# Patient Record
Sex: Female | Born: 1970
Health system: Southern US, Community
[De-identification: ages and names within clinical notes are randomized; demographics above are authoritative.]

## PROBLEM LIST (undated history)

## (undated) DIAGNOSIS — R3129 Other microscopic hematuria: Secondary | ICD-10-CM

## (undated) DIAGNOSIS — M545 Low back pain: Secondary | ICD-10-CM

## (undated) DIAGNOSIS — K589 Irritable bowel syndrome without diarrhea: Secondary | ICD-10-CM

## (undated) DIAGNOSIS — J302 Other seasonal allergic rhinitis: Secondary | ICD-10-CM

## (undated) DIAGNOSIS — G43909 Migraine, unspecified, not intractable, without status migrainosus: Secondary | ICD-10-CM

## (undated) DIAGNOSIS — J45909 Unspecified asthma, uncomplicated: Secondary | ICD-10-CM

## (undated) DIAGNOSIS — R32 Unspecified urinary incontinence: Secondary | ICD-10-CM

## (undated) DIAGNOSIS — D649 Anemia, unspecified: Secondary | ICD-10-CM

## (undated) DIAGNOSIS — Z87448 Personal history of other diseases of urinary system: Secondary | ICD-10-CM

## (undated) DIAGNOSIS — K5730 Diverticulosis of large intestine without perforation or abscess without bleeding: Secondary | ICD-10-CM

## (undated) DIAGNOSIS — N200 Calculus of kidney: Secondary | ICD-10-CM

## (undated) DIAGNOSIS — Z8719 Personal history of other diseases of the digestive system: Secondary | ICD-10-CM

## (undated) HISTORY — DX: Diverticulosis of large intestine without perforation or abscess without bleeding: K57.30

## (undated) HISTORY — DX: Other seasonal allergic rhinitis: J30.2

## (undated) HISTORY — DX: Migraine, unspecified, not intractable, without status migrainosus: G43.909

## (undated) HISTORY — DX: Anemia, unspecified: D64.9

## (undated) HISTORY — PX: OTHER SURGICAL HISTORY: SHX169

## (undated) HISTORY — DX: Low back pain: M54.5

## (undated) HISTORY — DX: Irritable bowel syndrome without diarrhea: K58.9

## (undated) HISTORY — DX: Personal history of other diseases of urinary system: Z87.448

## (undated) HISTORY — DX: Unspecified asthma, uncomplicated: J45.909

## (undated) HISTORY — DX: Calculus of kidney: N20.0

## (undated) HISTORY — DX: Personal history of other diseases of the digestive system: Z87.19

## (undated) HISTORY — DX: Unspecified urinary incontinence: R32

---
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
## 1996-11-27 HISTORY — PX: OTHER SURGICAL HISTORY: SHX169

## 1999-08-08 ENCOUNTER — Other Ambulatory Visit: Admission: RE | Admit: 1999-08-08 | Discharge: 1999-08-08 | Payer: Self-pay | Admitting: Gynecology

## 1999-09-22 ENCOUNTER — Encounter (INDEPENDENT_AMBULATORY_CARE_PROVIDER_SITE_OTHER): Payer: Self-pay | Admitting: Specialist

## 1999-09-22 ENCOUNTER — Other Ambulatory Visit: Admission: RE | Admit: 1999-09-22 | Discharge: 1999-09-22 | Payer: Self-pay | Admitting: Gynecology

## 1999-11-01 ENCOUNTER — Encounter: Payer: Self-pay | Admitting: Gastroenterology

## 1999-12-02 ENCOUNTER — Other Ambulatory Visit: Admission: RE | Admit: 1999-12-02 | Discharge: 1999-12-02 | Payer: Self-pay | Admitting: Internal Medicine

## 1999-12-31 ENCOUNTER — Encounter: Payer: Self-pay | Admitting: Family Medicine

## 1999-12-31 ENCOUNTER — Ambulatory Visit (HOSPITAL_COMMUNITY): Admission: RE | Admit: 1999-12-31 | Discharge: 1999-12-31 | Payer: Self-pay | Admitting: Family Medicine

## 2000-02-27 ENCOUNTER — Encounter: Payer: Self-pay | Admitting: Gastroenterology

## 2000-11-07 ENCOUNTER — Other Ambulatory Visit: Admission: RE | Admit: 2000-11-07 | Discharge: 2000-11-07 | Payer: Self-pay | Admitting: Gynecology

## 2001-02-13 ENCOUNTER — Other Ambulatory Visit: Admission: RE | Admit: 2001-02-13 | Discharge: 2001-02-13 | Payer: Self-pay | Admitting: Gynecology

## 2001-11-12 ENCOUNTER — Other Ambulatory Visit: Admission: RE | Admit: 2001-11-12 | Discharge: 2001-11-12 | Payer: Self-pay | Admitting: Gynecology

## 2002-01-27 ENCOUNTER — Other Ambulatory Visit: Admission: RE | Admit: 2002-01-27 | Discharge: 2002-01-27 | Payer: Self-pay | Admitting: Gynecology

## 2002-04-07 ENCOUNTER — Ambulatory Visit (HOSPITAL_COMMUNITY): Admission: RE | Admit: 2002-04-07 | Discharge: 2002-04-07 | Payer: Self-pay | Admitting: Internal Medicine

## 2002-04-07 ENCOUNTER — Encounter: Payer: Self-pay | Admitting: Gastroenterology

## 2002-04-07 ENCOUNTER — Encounter: Payer: Self-pay | Admitting: Internal Medicine

## 2002-05-27 ENCOUNTER — Other Ambulatory Visit: Admission: RE | Admit: 2002-05-27 | Discharge: 2002-05-27 | Payer: Self-pay | Admitting: Gynecology

## 2002-10-08 ENCOUNTER — Other Ambulatory Visit: Admission: RE | Admit: 2002-10-08 | Discharge: 2002-10-08 | Payer: Self-pay | Admitting: Gynecology

## 2003-01-07 ENCOUNTER — Other Ambulatory Visit: Admission: RE | Admit: 2003-01-07 | Discharge: 2003-01-07 | Payer: Self-pay | Admitting: Gynecology

## 2004-01-11 ENCOUNTER — Other Ambulatory Visit: Admission: RE | Admit: 2004-01-11 | Discharge: 2004-01-11 | Payer: Self-pay | Admitting: Gynecology

## 2005-01-02 ENCOUNTER — Other Ambulatory Visit: Admission: RE | Admit: 2005-01-02 | Discharge: 2005-01-02 | Payer: Self-pay | Admitting: Gynecology

## 2005-07-28 ENCOUNTER — Inpatient Hospital Stay (HOSPITAL_COMMUNITY): Admission: AD | Admit: 2005-07-28 | Discharge: 2005-07-31 | Payer: Self-pay | Admitting: Gynecology

## 2005-09-11 ENCOUNTER — Other Ambulatory Visit: Admission: RE | Admit: 2005-09-11 | Discharge: 2005-09-11 | Payer: Self-pay | Admitting: Gynecology

## 2005-11-14 ENCOUNTER — Ambulatory Visit: Payer: Self-pay | Admitting: Pulmonary Disease

## 2005-11-20 ENCOUNTER — Emergency Department (HOSPITAL_COMMUNITY): Admission: EM | Admit: 2005-11-20 | Discharge: 2005-11-20 | Payer: Self-pay | Admitting: Emergency Medicine

## 2005-11-21 ENCOUNTER — Ambulatory Visit: Payer: Self-pay | Admitting: Internal Medicine

## 2005-11-22 ENCOUNTER — Encounter: Payer: Self-pay | Admitting: Gastroenterology

## 2005-11-22 ENCOUNTER — Ambulatory Visit: Payer: Self-pay | Admitting: *Deleted

## 2006-02-22 ENCOUNTER — Ambulatory Visit: Payer: Self-pay | Admitting: Internal Medicine

## 2006-04-26 ENCOUNTER — Ambulatory Visit: Payer: Self-pay | Admitting: Internal Medicine

## 2006-08-20 ENCOUNTER — Ambulatory Visit: Payer: Self-pay | Admitting: Internal Medicine

## 2006-09-14 ENCOUNTER — Other Ambulatory Visit: Admission: RE | Admit: 2006-09-14 | Discharge: 2006-09-14 | Payer: Self-pay | Admitting: Gynecology

## 2006-09-17 ENCOUNTER — Ambulatory Visit (HOSPITAL_COMMUNITY): Admission: RE | Admit: 2006-09-17 | Discharge: 2006-09-17 | Payer: Self-pay | Admitting: Gynecology

## 2006-12-20 ENCOUNTER — Ambulatory Visit: Payer: Self-pay | Admitting: Internal Medicine

## 2007-05-29 ENCOUNTER — Ambulatory Visit (HOSPITAL_COMMUNITY): Admission: RE | Admit: 2007-05-29 | Discharge: 2007-05-29 | Payer: Self-pay | Admitting: Obstetrics and Gynecology

## 2007-06-19 ENCOUNTER — Ambulatory Visit (HOSPITAL_COMMUNITY): Admission: RE | Admit: 2007-06-19 | Discharge: 2007-06-19 | Payer: Self-pay | Admitting: Obstetrics and Gynecology

## 2007-08-30 ENCOUNTER — Inpatient Hospital Stay (HOSPITAL_COMMUNITY): Admission: AD | Admit: 2007-08-30 | Discharge: 2007-08-30 | Payer: Self-pay | Admitting: Obstetrics and Gynecology

## 2007-09-04 ENCOUNTER — Inpatient Hospital Stay (HOSPITAL_COMMUNITY): Admission: AD | Admit: 2007-09-04 | Discharge: 2007-09-07 | Payer: Self-pay | Admitting: Obstetrics and Gynecology

## 2007-12-24 ENCOUNTER — Encounter: Payer: Self-pay | Admitting: Internal Medicine

## 2007-12-24 DIAGNOSIS — Z87448 Personal history of other diseases of urinary system: Secondary | ICD-10-CM

## 2007-12-24 DIAGNOSIS — Z8719 Personal history of other diseases of the digestive system: Secondary | ICD-10-CM

## 2007-12-24 DIAGNOSIS — M545 Low back pain: Secondary | ICD-10-CM | POA: Insufficient documentation

## 2007-12-24 HISTORY — DX: Personal history of other diseases of the digestive system: Z87.19

## 2008-04-30 ENCOUNTER — Encounter: Admission: RE | Admit: 2008-04-30 | Discharge: 2008-07-29 | Payer: Self-pay | Admitting: Obstetrics and Gynecology

## 2008-06-10 ENCOUNTER — Ambulatory Visit: Payer: Self-pay | Admitting: Internal Medicine

## 2008-06-10 LAB — CONVERTED CEMR LAB
ALT: 25 units/L (ref 0–35)
Basophils Absolute: 0.1 10*3/uL (ref 0.0–0.1)
Bilirubin Urine: NEGATIVE
Bilirubin, Direct: 0.1 mg/dL (ref 0.0–0.3)
CO2: 30 meq/L (ref 19–32)
Calcium: 10 mg/dL (ref 8.4–10.5)
Cholesterol: 202 mg/dL (ref 0–200)
Creatinine, Ser: 0.7 mg/dL (ref 0.4–1.2)
Crystals: NEGATIVE
Direct LDL: 134.5 mg/dL
Eosinophils Absolute: 0.3 10*3/uL (ref 0.0–0.7)
GFR calc Af Amer: 121 mL/min
HCT: 35.4 % — ABNORMAL LOW (ref 36.0–46.0)
Hemoglobin: 12.5 g/dL (ref 12.0–15.0)
Leukocytes, UA: NEGATIVE
MCHC: 35.4 g/dL (ref 30.0–36.0)
MCV: 83.4 fL (ref 78.0–100.0)
Monocytes Absolute: 0.6 10*3/uL (ref 0.1–1.0)
Monocytes Relative: 6.8 % (ref 3.0–12.0)
Neutro Abs: 4.1 10*3/uL (ref 1.4–7.7)
Nitrite: NEGATIVE
Platelets: 306 10*3/uL (ref 150–400)
RDW: 11.7 % (ref 11.5–14.6)
Sodium: 141 meq/L (ref 135–145)
TSH: 0.87 microintl units/mL (ref 0.35–5.50)
Total Bilirubin: 0.8 mg/dL (ref 0.3–1.2)
Total CHOL/HDL Ratio: 4.6
Total Protein: 7.9 g/dL (ref 6.0–8.3)
Triglycerides: 161 mg/dL — ABNORMAL HIGH (ref 0–149)
Urobilinogen, UA: 0.2 (ref 0.0–1.0)
WBC, UA: NONE SEEN cells/hpf

## 2008-06-15 ENCOUNTER — Ambulatory Visit: Payer: Self-pay | Admitting: Internal Medicine

## 2008-06-15 DIAGNOSIS — R32 Unspecified urinary incontinence: Secondary | ICD-10-CM

## 2008-06-15 HISTORY — DX: Unspecified urinary incontinence: R32

## 2008-06-25 ENCOUNTER — Ambulatory Visit: Payer: Self-pay | Admitting: Internal Medicine

## 2008-06-25 ENCOUNTER — Telehealth: Payer: Self-pay | Admitting: Internal Medicine

## 2008-06-25 LAB — CONVERTED CEMR LAB
Bacteria, UA: NEGATIVE
Bilirubin Urine: NEGATIVE
Crystals: NEGATIVE
Ketones, ur: NEGATIVE mg/dL
Leukocytes, UA: NEGATIVE
Nitrite: NEGATIVE
Specific Gravity, Urine: 1.01 (ref 1.000–1.03)
Urobilinogen, UA: 0.2 (ref 0.0–1.0)
pH: 7 (ref 5.0–8.0)

## 2008-06-26 ENCOUNTER — Telehealth: Payer: Self-pay | Admitting: Internal Medicine

## 2008-11-05 ENCOUNTER — Ambulatory Visit: Payer: Self-pay | Admitting: Internal Medicine

## 2008-11-07 LAB — CONVERTED CEMR LAB
Basophils Absolute: 0.1 10*3/uL (ref 0.0–0.1)
Bilirubin Urine: NEGATIVE
Leukocytes, UA: NEGATIVE
Lymphocytes Relative: 28.8 % (ref 12.0–46.0)
MCHC: 35 g/dL (ref 30.0–36.0)
Monocytes Relative: 5.2 % (ref 3.0–12.0)
Neutro Abs: 7.2 10*3/uL (ref 1.4–7.7)
Neutrophils Relative %: 61.7 % (ref 43.0–77.0)
Nitrite: NEGATIVE
Platelets: 314 10*3/uL (ref 150–400)
RDW: 11.7 % (ref 11.5–14.6)
Specific Gravity, Urine: 1.025 (ref 1.000–1.03)
pH: 6 (ref 5.0–8.0)

## 2009-04-14 ENCOUNTER — Ambulatory Visit: Payer: Self-pay | Admitting: Gastroenterology

## 2009-04-14 DIAGNOSIS — R109 Unspecified abdominal pain: Secondary | ICD-10-CM

## 2009-04-14 DIAGNOSIS — R197 Diarrhea, unspecified: Secondary | ICD-10-CM | POA: Insufficient documentation

## 2009-04-14 DIAGNOSIS — R1032 Left lower quadrant pain: Secondary | ICD-10-CM

## 2009-04-14 LAB — CONVERTED CEMR LAB
ALT: 20 units/L (ref 0–35)
AST: 21 units/L (ref 0–37)
Basophils Absolute: 0.1 10*3/uL (ref 0.0–0.1)
Calcium: 9.7 mg/dL (ref 8.4–10.5)
Chloride: 107 meq/L (ref 96–112)
Creatinine, Ser: 0.8 mg/dL (ref 0.4–1.2)
Eosinophils Absolute: 0.5 10*3/uL (ref 0.0–0.7)
HCT: 38 % (ref 36.0–46.0)
Hemoglobin: 13.1 g/dL (ref 12.0–15.0)
Lymphs Abs: 2.8 10*3/uL (ref 0.7–4.0)
MCHC: 34.5 g/dL (ref 30.0–36.0)
Neutro Abs: 4.1 10*3/uL (ref 1.4–7.7)
RDW: 12.5 % (ref 11.5–14.6)
Sodium: 141 meq/L (ref 135–145)
Total Bilirubin: 0.5 mg/dL (ref 0.3–1.2)

## 2009-04-19 ENCOUNTER — Ambulatory Visit: Payer: Self-pay | Admitting: Gastroenterology

## 2009-04-19 HISTORY — PX: COLONOSCOPY: SHX174

## 2009-04-20 ENCOUNTER — Encounter (INDEPENDENT_AMBULATORY_CARE_PROVIDER_SITE_OTHER): Payer: Self-pay

## 2009-06-24 ENCOUNTER — Ambulatory Visit: Payer: Self-pay | Admitting: Internal Medicine

## 2009-06-27 HISTORY — PX: OTHER SURGICAL HISTORY: SHX169

## 2009-06-27 HISTORY — PX: TOTAL VAGINAL HYSTERECTOMY: SHX2548

## 2009-06-27 HISTORY — PX: ABDOMINAL HYSTERECTOMY: SHX81

## 2009-07-21 ENCOUNTER — Ambulatory Visit: Payer: Self-pay | Admitting: Gastroenterology

## 2009-07-21 DIAGNOSIS — K5730 Diverticulosis of large intestine without perforation or abscess without bleeding: Secondary | ICD-10-CM | POA: Insufficient documentation

## 2009-07-21 DIAGNOSIS — K589 Irritable bowel syndrome without diarrhea: Secondary | ICD-10-CM

## 2009-07-21 HISTORY — DX: Diverticulosis of large intestine without perforation or abscess without bleeding: K57.30

## 2009-07-21 HISTORY — DX: Irritable bowel syndrome, unspecified: K58.9

## 2009-12-22 ENCOUNTER — Telehealth: Payer: Self-pay | Admitting: Internal Medicine

## 2010-07-23 ENCOUNTER — Ambulatory Visit: Payer: Self-pay | Admitting: Diagnostic Radiology

## 2010-07-23 ENCOUNTER — Emergency Department (HOSPITAL_BASED_OUTPATIENT_CLINIC_OR_DEPARTMENT_OTHER): Admission: EM | Admit: 2010-07-23 | Discharge: 2010-07-23 | Payer: Self-pay | Admitting: Emergency Medicine

## 2010-12-19 ENCOUNTER — Encounter: Payer: Self-pay | Admitting: Obstetrics and Gynecology

## 2010-12-27 NOTE — Progress Notes (Signed)
Summary: head injury?  Phone Note Call from Patient Call back at Home Phone [phone]   Summary of Call: Patient left message on triage that she hit her head this AM. Patient states that it is a tingle/stinging sensation abover her left ear and she has a headache. Per the patient it is not severe, but she would like to know what MD recommends. Initial call taken by: Lucious Groves,  December 22, 2009 12:04 PM  Follow-up for Phone Call        Per MD he will be more than happy to see her, but if it is not severe and she does not wish to come to the office, take OTC pain relief like Aleve and call if symptoms worsen.  Patient notified, and will take otc pain reliever and call if symptoms worsen. Per patient she was not bleeding, and did not have a knot/bump from the injury, she just didn't feel well when she went to [place] this AM. Patient will call as needed. Follow-up by: Lucious Groves,  December 22, 2009 12:06 PM

## 2011-04-11 NOTE — H&P (Signed)
[name], [name]              ACCOUNT NO.:  [id number]   MEDICAL RECORD NO.:  [id number]          PATIENT TYPE:  INP   LOCATION:  9113                          [facility]ACILITY:  [facility]   PHYSICIAN:  Naima A. Dillard, M.D. DATE OF BIRTH:  Nov 04, 1971   DATE OF ADMISSION:  09/04/2007  DATE OF DISCHARGE:                              HISTORY & PHYSICAL   HISTORY OF PRESENT ILLNESS:  [name] is a 40 year old, G2, P1-0-0-1  at 38-2/7 weeks' who presented complaining of spontaneous rupture of  membranes approximately at 8:30 p.m. with clear fluid noted.  Uterine  contractions every 10 minutes per patient report.  Group B Streptococcus  culture is negative.  Pregnancy has been remarkable for previous  cesarean section with patient desiring VBAC.  She progressed to  completely dilated and pushed for 2 hours with her last baby.  History  of LEEP procedure and laser surgery on cervix.  She has had migraines  and has advanced maternal age with a normal quadruple screen, but  amniocentesis declined.   PRENATAL LABORATORY DATA:  Blood type is O positive, Rh antibody  negative.  VDRL nonreactive.  Rubella titer positive.  Hepatitis B  surface antigen negative.  HIV nonreactive.  GC and chlamydia cultures  were negative.  Pap was normal on October 2007.  Hemoglobin upon  entering the practice was 11.8.  It was within normal limits at 28  weeks.  The patient declined first-trimester screening.  Quadruple  screen was normal.  She had a Glucola that was 136.  She had a 3-hour  GTT that was normal.  Group B Streptococcus culture was negative at 36  weeks.  HIV was nonreactive.   PRENATAL COURSE:  The patient entered care at approximately 10 weeks.  She initially planned nurse midwife care, but then changed to physician  care with Dr. Stefano Gaul.  She did want VBAC.  VBAC consent form was  signed at 18 weeks.  She had an ultrasound at 14 weeks secondary to  history of LEEP procedure with cervical length  adequate.  She had  another ultrasound at 18 weeks showed normal growth with cervical length  of 3.02.  Quadruple screen was done at that time.  At 22 weeks, she had  another ultrasound at 23 weeks secondary to cervical length.  Cervix was  within normal limits, but there was evidence of lateral ventricle  dilated.  Maternal fetal medicine consult was anticipated.  Dr. Estanislado Pandy  was consulted as well.  A followup ultrasound was done with maternal  fetal medicine on July 2, showing ventricles normal and otherwise normal  anatomy.  At 23 weeks, these were normal.  Normal quadruple screen.  The  patient declined the followup ultrasound.  She had an elevated 1-hour  Glucola at 136.  She had a normal 3-hour GTT.  The rest of her pregnancy  was essentially uncomplicated.  Group B Streptococcus culture was  negative at 36 weeks.   PAST OBSTETRICAL HISTORY:  In 2006, she had a primary low transverse  cesarean section for a female infant with weight 7 pounds at 40 weeks.  She was in labor 8-9 hours.  She had epidural anesthesia.  She had a C-  section for failure to progress and pushed x2 hours.  She did take iron  supplementation during that.   PAST MEDICAL HISTORY:  1. In 1995 and 1996, she had a LEEP procedure and laser surgery of the      cervix and cryosurgery.  2. Usual childhood illnesses.  3. Questionable heart murmur in the past, but has not required any      medication.  This was only present with her last pregnancy.  4. Anemia in 1995.  5. Exercised-induced asthma, but that has not been a problem in      several years.  6. She has occasional migraines.  7. She reports occasional UTIs.  8. At age 12, she had an accident that involved her arm.   PAST SURGICAL HISTORY:  1. LEEP in 1995.  2. C-section in 2006.   ALLERGIES:  No known medication allergies.   FAMILY HISTORY:  Her mother is hypertensive on medication.  Maternal  grandmother had diabetes.  Maternal grandfather had some  type of eye  cancer.   GENETIC HISTORY:  The patient's age is 89 with amniocentesis declined,  but normal quadruple screen.   SOCIAL HISTORY:  The patient is married to the father of the baby.  He  is involved and supportive.  His name is Statistician.  The patient is  Hispanic in ethnicity.  She is of the [employer].  She is college  educated.  She is in real estate.  Her husband is also college educated  and is a Conservation officer, nature.  She has been followed initially by the midwife  service, but then switched to physician care.  She denied any alcohol,  drug or tobacco use during this pregnancy.   PHYSICAL EXAMINATION:  VITAL SIGNS:  Initial blood pressure is 143/92.  Followup blood pressures were normal.  Other vital signs were stable.  HEENT:  Within normal limits.  LUNGS:  Breath sounds are clear.  HEART:  Regular rate and rhythm without murmurs.  BREASTS:  Soft and nontender.  ABDOMEN:  Fundal height is approximately 38 cm.  Estimated fetal weight  7 to 7-1/2 pounds.  Uterine contractions are every 5 minutes with some  small uterine contractions between.  Abdomen is soft and nontender  between contractions.  Fetal heart was reactive with no decelerations.  PELVIC:  The patient is noted to be leaking clear fluid.  Cervix is 5  cm, 100%, vertex at -2 station with forewaters noted.  EXTREMITIES:  Deep tendon reflexes are 2+ without clonus.  There is a  trace edema noted.   IMPRESSION:  1. Intrauterine pregnancy at 38-2/7 weeks.  2. Active labor.  3. Previous cesarean section with plan for vaginal birth after      cesarean.  4. Negative Group B Streptococcus.   PLAN:  1. Admit to birthing suite for consult with Dr. Normand Sloop, attending      physician.  2. Routine physician orders.  3. Epidural p.r.n.      Renaldo Reel Emilee Hero, C.N.M.      Naima A. Normand Sloop, M.D.  Electronically Signed    VLL/MEDQ  D:  09/04/2007  T:  09/05/2007  Job:  [id number]

## 2011-04-14 NOTE — Op Note (Signed)
[name], [name]              ACCOUNT NO.:  [id number]   MEDICAL RECORD NO.:  [id number]          PATIENT TYPE:  INP   LOCATION:  9316                          FACILITY:  [facility]   PHYSICIAN:  Ivor Costa. Farrel Gobble, M.D. DATE OF BIRTH:  09-Sep-1971   DATE OF PROCEDURE:  07/29/2005  DATE OF DISCHARGE:                                 OPERATIVE REPORT   PREOPERATIVE DIAGNOSIS:  Deep OT arrest.   POSTOPERATIVE DIAGNOSIS:  Deep OT arrest.   PROCEDURE:  Primary cesarean section, low flap transverse.   SURGEON:  Ivor Costa. Farrel Gobble, M.D.   ANESTHESIA:  Epidural.   FLUIDS REPLACED:  2800 mL of Ringer's lactate.   ESTIMATED BLOOD LOSS:  400 mL.   URINE OUTPUT:  400 mL of clear urine.   FINDINGS:  A viable female in the ROT presentation, thin meconium, Apgars 9  and 9, birth weight 7 pounds.  Edematous changes to the bladder.  Normal  uterus, tubes, and ovaries.   COMPLICATIONS:  None.   PATHOLOGY:  None.   DESCRIPTION OF PROCEDURE:  The patient was taken to the operating room and  placed in the supine position with left lateral displacement.  Prepped and  draped in the usual sterile fashion.  After adequate anesthesia was ensured,  a Pfannenstiel skin incision was made with the scalpel and carried through  the underlying layer of fascia with electrocautery.  The fascia was scored  in the midline and the incision was extended laterally with the  electrocautery.  The inferior aspect of the fascial incision was grasped  with Kochers.  Underlying rectus muscles were dissected off by blunt and  sharp dissection.  In a similar fashion the superior aspect of the incision  was grasped with Kochers and the underlying rectus muscle were dissected  off.  The rectus muscles were naturally separated in the midline.  The  peritoneum was identified and entered bluntly.  The peritoneal incision was  then extended superiorly and inferiorly with good visualization of  underlying bowel and bladder.  The  bladder blade was inserted.  The bladder  was noted to be markedly edematous.  The vesicouterine peritoneum was  identified, tented up, and entered sharply with the Metzenbaum scissors.  The incision was extended laterally.  Because of the marked edema of the  bladder as well as the varicosity at the bladder edge, we did not drop the  bladder.  Instead we lifted the peritoneum superiorly.  The transverse  incision was then made with the scalpel.  At the upper edge of the cleared  area, thin meconium was noted upon entering the cavity.  The incision was  then extended bluntly.  The infant was then delivered from the vertex  presentation.  The cord was cut and clamped and handed off to the awaiting  pediatricians.  Cord blood was obtained.  The uterus was massaged.  The  placenta was allowed to separate naturally.  The uterus was then cleared of  all clots and debris.  The uterine incision was repaired with a running  locked layer of 0 chromic and a second suture  was used for imbrication.  The  pelvis was then irrigated with copious amount of warm saline.  The adnexa  were inspected and noted to be unremarkable.  The gutters were cleared of  all clots and debris.  The incision was inspected and noted to be  hemostatic.  The peritoneum was inspected and treated where appropriate.  The fascia was closed with 0 Vicryl in a running fashion.  The subcu was  irrigated and reapproximated with 3-0 plain.  The skin was closed with 4-0  Vicryl on a [name].  Steri-Strips were placed.  The patient tolerated the  procedure well.  Needle, sponge, and instrument counts correct x2.  She  received Ancef intraoperatively and transferred to the PACU in stable  condition.      Ivor Costa. Farrel Gobble, M.D.  Electronically Signed     THL/MEDQ  D:  07/29/2005  T:  07/29/2005  Job:  [id number]

## 2011-04-14 NOTE — Discharge Summary (Signed)
[name], [name]              ACCOUNT NO.:  [id number]   MEDICAL RECORD NO.:  [id number]          PATIENT TYPE:  INP   LOCATION:  9316                          [facility]ILITY:  [facility]   PHYSICIAN:  Ivor Costa. Farrel Gobble, M.D. DATE OF BIRTH:  Jan 19, 1971   DATE OF ADMISSION:  07/28/2005  DATE OF DISCHARGE:  07/30/2005                                 DISCHARGE SUMMARY   PRINCIPAL DIAGNOSIS:  Term pregnancy.   PRINCIPAL PROCEDURE:  Primary cesarean section.   HOSPITAL COURSE:  The patient presented on the evening of July 28, 2005,  in spontaneous labor with gross rupture of membranes at 6 cm.  She  progressed to fully dilated without anesthesia but began having terrible  pain and therefore received an epidural secondary to discomfort.  She  required Pitocin augmentation after the epidural was placed and after  approximately an hour plus of pushing, the patient failed to descend.  The  vaginal exam at that point showed the patient to be, the deep ROT with  marked asynclitism of the vertex with the fetal ear below the pubic  symphysis.  Based on that, the patient was transferred to the OR and  underwent a primary cesarean section for delivery of a viable female with  Apgars of 9 and 9, birth weight of 7 pounds 0 ounces, with normal uterus,  tubes, and ovaries.  Estimated blood loss intraoperatively was approximately  400 mL.  Her postoperative course was unremarkable.   On postop day #2, the patient was breast-feeding without difficulty,  ambulating without difficulty and tolerating pain medicine and was ready for  discharge.  She remained afebrile throughout.  Her abdomen was soft,  nontender, without rebound or guarding.  Her uterus was firm and below the  umbilicus.  The incision was clean, dry, and intact with suture.  The  extremities were negative.   Postop labs:  Her white count was 19.5, her hemoglobin was 9.1, her  hematocrit was 27.2, her platelets were 298.   DISCHARGE STATUS:   Stable.  The patient was discharged home with a  prescription for Percocet 5/325 mg one to two q.4h. p.r.n. #30, instructed  to use over-the-counter Motrin, instructed to follow up in the office in six  weeks.      Ivor Costa. Farrel Gobble, M.D.  Electronically Signed     THL/MEDQ  D:  07/31/2005  T:  07/31/2005  Job:  [id number]

## 2011-06-15 ENCOUNTER — Encounter: Payer: Self-pay | Admitting: Internal Medicine

## 2011-06-15 ENCOUNTER — Ambulatory Visit (INDEPENDENT_AMBULATORY_CARE_PROVIDER_SITE_OTHER): Payer: 59 | Admitting: Internal Medicine

## 2011-06-15 DIAGNOSIS — J019 Acute sinusitis, unspecified: Secondary | ICD-10-CM | POA: Insufficient documentation

## 2011-06-15 DIAGNOSIS — F419 Anxiety disorder, unspecified: Secondary | ICD-10-CM

## 2011-06-15 DIAGNOSIS — J209 Acute bronchitis, unspecified: Secondary | ICD-10-CM

## 2011-06-15 DIAGNOSIS — H6990 Unspecified Eustachian tube disorder, unspecified ear: Secondary | ICD-10-CM

## 2011-06-15 DIAGNOSIS — H6980 Other specified disorders of Eustachian tube, unspecified ear: Secondary | ICD-10-CM

## 2011-06-15 DIAGNOSIS — F411 Generalized anxiety disorder: Secondary | ICD-10-CM

## 2011-06-15 MED ORDER — HYDROCODONE-HOMATROPINE 5-1.5 MG/5ML PO SYRP: 5.0000 mL | ORAL_SOLUTION | Freq: Four times a day (QID) | ORAL | Status: AC | PRN

## 2011-06-15 MED ORDER — LEVOFLOXACIN 500 MG PO TABS: 500.0000 mg | ORAL_TABLET | Freq: Every day | ORAL | Status: AC

## 2011-06-15 NOTE — Assessment & Plan Note (Signed)
Mild to mod, for antibx course,  to f/u any worsening symptoms or concerns 

## 2011-06-15 NOTE — Assessment & Plan Note (Signed)
For mucinex otc prn,  to f/u any worsening symptoms or concerns  

## 2011-06-15 NOTE — Patient Instructions (Signed)
Take all new medications as prescribed Continue all other medications as before You can also take  Mucinex (or it's generic off brand) for congestion (chest, sinus, ears)

## 2011-06-15 NOTE — Assessment & Plan Note (Signed)
Mild situational, ok to follow without meds at this time

## 2011-06-15 NOTE — Progress Notes (Signed)
  Subjective:    Patient ID: [name], female    DOB: [birth year], 40 y.o.   MRN: [id number]  HPI   Here with 3 days acute onset fever, facial pain, pressure, general weakness and malaise, and greenish d/c, with slight ST, but little to no cough and Pt denies chest pain, increased sob or doe, wheezing, orthopnea, PND, increased LE swelling, palpitations, dizziness or syncope.  Also Here with acute onset mild to mod 2-3 days ST, HA, general weakness and malaise, with prod cough greenish sputum.  Denies worsening depressive symptoms, suicidal ideation, or panic, but mild anxiety increased recently with illness.  Pt denies new neurological symptoms such as new headache, or facial or extremity weakness or numbness   Pt denies polydipsia, polyuria.  Does also have some left ear fullness with popping/crackling but no worsening hearing loss, veritgo, n/v Past Medical History  Diagnosis Date  . Abdominal pain, left lower quadrant 04/14/2009  . ABDOMINAL PAIN-MULTIPLE SITES 04/14/2009  . Diarrhea 04/14/2009  . DIVERTICULITIS, HX OF 12/24/2007  . DIVERTICULOSIS-COLON 07/21/2009  . HEMATURIA, HX OF 12/24/2007  . Irritable bowel syndrome 07/21/2009  . LOW BACK PAIN 12/24/2007  . URINARY INCONTINENCE 06/15/2008   Past Surgical History  Procedure Date  . Abdominal hysterectomy 06/2009  . Rectocele/cystocele repair 06/2009  . Leap 98  . Right fibula fx     age 45  . G2 p2     1 c-section, 1 NSVD    reports that she has quit smoking. She does not have any smokeless tobacco history on file. She reports that she does not use illicit drugs. Her alcohol history not on file. family history includes Colon polyps in her mother; Diabetes in her maternal grandmother; Hyperlipidemia in her mother; and Hypertension in her mother. No Known Allergies No current outpatient prescriptions on file prior to visit.   Review of Systems Review of Systems  Constitutional: Negative for diaphoresis and unexpected weight change.    HENT: Negative for drooling and tinnitus.   Eyes: Negative for photophobia and visual disturbance.  Respiratory: Negative for choking and stridor.   Gastrointestinal: Negative for vomiting and blood in stool.  Genitourinary: Negative for hematuria and decreased urine volume.      Objective:   Physical Exam BP 98/72  Pulse 90  Temp(Src) 98.1 F (36.7 C) (Oral)  Ht 5' (1.524 m)  Wt 128 lb (58.06 kg)  BMI 25.00 kg/m2  SpO2 97% Physical Exam  VS noted, mild ill Constitutional: Pt appears well-developed and well-nourished.  HENT: Head: Normocephalic.  Right Ear: External ear normal.  Right TM clear Left Ear: External ear normal.  Left TM mod erythema Bilat tm's mild erythema.  Sinus tender bilat.  Pharynx mild erythema Eyes: Conjunctivae and EOM are normal. Pupils are equal, round, and reactive to light.  Neck: Normal range of motion. Neck supple.  Cardiovascular: Normal rate and regular rhythm.   Pulmonary/Chest: Effort normal and breath sounds normal.  Neurological: Pt is alert. No cranial nerve deficit.  Skin: Skin is warm. No erythema.  Psychiatric: Pt behavior is normal. Thought content normal. 1+ nervous        Assessment & Plan:

## 2011-07-07 ENCOUNTER — Encounter (HOSPITAL_BASED_OUTPATIENT_CLINIC_OR_DEPARTMENT_OTHER): Payer: Self-pay | Admitting: *Deleted

## 2011-07-07 ENCOUNTER — Emergency Department (HOSPITAL_BASED_OUTPATIENT_CLINIC_OR_DEPARTMENT_OTHER)
Admission: EM | Admit: 2011-07-07 | Discharge: 2011-07-07 | Disposition: A | Discharge status: Other Acute Inpatient Hospital | Payer: 59 | Source: Home / Self Care | Attending: Emergency Medicine | Admitting: Emergency Medicine

## 2011-07-07 ENCOUNTER — Emergency Department (INDEPENDENT_AMBULATORY_CARE_PROVIDER_SITE_OTHER): Payer: 59

## 2011-07-07 ENCOUNTER — Inpatient Hospital Stay (HOSPITAL_COMMUNITY)
Admission: AD | Admit: 2011-07-07 | Discharge: 2011-07-09 | DRG: 203 | Disposition: A | Discharge status: Home / Self Care | Payer: 59 | Source: Other Acute Inpatient Hospital | Attending: Internal Medicine | Admitting: Internal Medicine

## 2011-07-07 DIAGNOSIS — J45902 Unspecified asthma with status asthmaticus: Secondary | ICD-10-CM

## 2011-07-07 DIAGNOSIS — D72829 Elevated white blood cell count, unspecified: Secondary | ICD-10-CM | POA: Diagnosis present

## 2011-07-07 DIAGNOSIS — J40 Bronchitis, not specified as acute or chronic: Secondary | ICD-10-CM

## 2011-07-07 DIAGNOSIS — R7309 Other abnormal glucose: Secondary | ICD-10-CM | POA: Diagnosis present

## 2011-07-07 DIAGNOSIS — J45909 Unspecified asthma, uncomplicated: Principal | ICD-10-CM | POA: Diagnosis present

## 2011-07-07 DIAGNOSIS — R0602 Shortness of breath: Secondary | ICD-10-CM

## 2011-07-07 DIAGNOSIS — R05 Cough: Secondary | ICD-10-CM

## 2011-07-07 DIAGNOSIS — K589 Irritable bowel syndrome without diarrhea: Secondary | ICD-10-CM | POA: Insufficient documentation

## 2011-07-07 LAB — DIFFERENTIAL
Basophils Relative: 0 % (ref 0–1)
Basophils Relative: 0 % (ref 0–1)
Eosinophils Absolute: 0.9 10*3/uL — ABNORMAL HIGH (ref 0.0–0.7)
Eosinophils Absolute: 0 10*3/uL (ref 0.0–0.7)
Lymphs Abs: 0.9 10*3/uL (ref 0.7–4.0)
Monocytes Absolute: 0.8 10*3/uL (ref 0.1–1.0)
Monocytes Relative: 7 % (ref 3–12)
Neutro Abs: 8.1 10*3/uL — ABNORMAL HIGH (ref 1.7–7.7)
Neutro Abs: 7.9 10*3/uL — ABNORMAL HIGH (ref 1.7–7.7)
Neutrophils Relative %: 85 % — ABNORMAL HIGH (ref 43–77)

## 2011-07-07 LAB — CBC
HCT: 37.4 % (ref 36.0–46.0)
Hemoglobin: 12.9 g/dL (ref 12.0–15.0)
Hemoglobin: 12.1 g/dL (ref 12.0–15.0)
MCH: 29.8 pg (ref 26.0–34.0)
MCHC: 34.5 g/dL (ref 30.0–36.0)
Platelets: 296 10*3/uL (ref 150–400)
RBC: 4.23 MIL/uL (ref 3.87–5.11)
WBC: 9.3 10*3/uL (ref 4.0–10.5)

## 2011-07-07 LAB — BASIC METABOLIC PANEL
BUN: 10 mg/dL (ref 6–23)
CO2: 23 mEq/L (ref 19–32)
Calcium: 10.3 mg/dL (ref 8.4–10.5)
Chloride: 100 mEq/L (ref 96–112)
Chloride: 103 mEq/L (ref 96–112)
Creatinine, Ser: 0.6 mg/dL (ref 0.50–1.10)
GFR calc non Af Amer: >60 mL/min (ref >60)
Potassium: 4 mEq/L (ref 3.5–5.1)
Sodium: 137 mEq/L (ref 135–145)

## 2011-07-07 LAB — MRSA PCR SCREENING: MRSA by PCR: NEGATIVE

## 2011-07-07 MED ORDER — ALBUTEROL SULFATE (5 MG/ML) 0.5% IN NEBU
INHALATION_SOLUTION | RESPIRATORY_TRACT | Status: AC
  Administered 2011-07-07: 5 mg via RESPIRATORY_TRACT
  Filled 2011-07-07: qty 1

## 2011-07-07 MED ORDER — MOXIFLOXACIN HCL IN NACL 400 MG/250ML IV SOLN
400.0000 mg | Freq: Once | INTRAVENOUS | Status: AC
  Administered 2011-07-07: 400 mg via INTRAVENOUS
  Filled 2011-07-07: qty 250

## 2011-07-07 MED ORDER — IPRATROPIUM BROMIDE 0.02 % IN SOLN
RESPIRATORY_TRACT | Status: AC
  Administered 2011-07-07: 0.5 mg via RESPIRATORY_TRACT
  Filled 2011-07-07: qty 2.5

## 2011-07-07 MED ORDER — ALBUTEROL SULFATE (5 MG/ML) 0.5% IN NEBU
INHALATION_SOLUTION | RESPIRATORY_TRACT | Status: AC
  Filled 2011-07-07: qty 1

## 2011-07-07 MED ORDER — METHYLPREDNISOLONE SODIUM SUCC 125 MG IJ SOLR
125.0000 mg | Freq: Once | INTRAMUSCULAR | Status: AC
  Administered 2011-07-07: 125 mg via INTRAVENOUS
  Filled 2011-07-07: qty 2

## 2011-07-07 MED ORDER — IPRATROPIUM BROMIDE 0.02 % IN SOLN
0.5000 mg | RESPIRATORY_TRACT | Status: DC
  Administered 2011-07-07 (×2): 0.5 mg via RESPIRATORY_TRACT

## 2011-07-07 MED ORDER — ALBUTEROL SULFATE (5 MG/ML) 0.5% IN NEBU
2.5000 mg | INHALATION_SOLUTION | RESPIRATORY_TRACT | Status: DC
  Administered 2011-07-07: 5 mg via RESPIRATORY_TRACT

## 2011-07-07 MED ORDER — ALBUTEROL SULFATE (5 MG/ML) 0.5% IN NEBU
5.0000 mg | INHALATION_SOLUTION | Freq: Once | RESPIRATORY_TRACT | Status: AC
  Administered 2011-07-07: 5 mg via RESPIRATORY_TRACT

## 2011-07-07 MED ORDER — IPRATROPIUM BROMIDE 0.02 % IN SOLN
0.5000 mg | Freq: Once | RESPIRATORY_TRACT | Status: AC
  Administered 2011-07-07: 0.5 mg via RESPIRATORY_TRACT

## 2011-07-07 NOTE — ED Notes (Signed)
Pt was put back on [place] 2 lpm for low oxygen saturation on room air but when pt took deep breaths on room air, SPO2 would increase

## 2011-07-07 NOTE — ED Notes (Signed)
Family at bedside. 

## 2011-07-07 NOTE — ED Notes (Signed)
Pt arrived in ED with [facility]. Pt stated that she has a hx of exercise induced asthma and reports being sick with cold like symptoms for the past couple of days and has been using her inhaler everyday for the past few wks. Pt SPO2 on RA in ED was 88-90%. Pt was given [place] 2 lpm for low oxygen saturations which improved to 94%. Pt was also given HHN Albuterol and Atrovent 5.5mg  total for SHOB symptoms. Pt stated that the [facility] helped somewhat and can breath slightly better.

## 2011-07-07 NOTE — ED Notes (Signed)
Patient is resting comfortably. 

## 2011-07-07 NOTE — ED Provider Notes (Signed)
History     CSN: [id number] Arrival date & time: 07/07/2011  3:04 AM  Chief Complaint  Patient presents with  . Respiratory Distress   HPI This is a 40 year old female with a history of asthma. She has had a three-week history of cough, wheezing and dyspnea. She was placed on a week of antibiotics by her primary care physician. She was also given an antitussive. Despite these treatments she per system having episodes of wheezing and dyspnea. She has been using her albuterol as often as every hour and a half. The symptoms are moderate to severe. Dyspnea is exacerbated by exertion. She had a fever to 100 yesterday. She has a headache exacerbated by cough. She denies nausea vomiting or diarrhea. She has had a cough productive of sputum. She was given albuterol and Atrovent nebulization treatment on arrival to the ED but states that the effects of this are wearing off and she feels like she needs another treatment.  Past Medical History  Diagnosis Date  . Abdominal pain, left lower quadrant 04/14/2009  . ABDOMINAL PAIN-MULTIPLE SITES 04/14/2009  . Diarrhea 04/14/2009  . DIVERTICULITIS, HX OF 12/24/2007  . DIVERTICULOSIS-COLON 07/21/2009  . HEMATURIA, HX OF 12/24/2007  . Irritable bowel syndrome 07/21/2009  . LOW BACK PAIN 12/24/2007  . URINARY INCONTINENCE 06/15/2008    Past Surgical History  Procedure Date  . Abdominal hysterectomy 06/2009  . Rectocele/cystocele repair 06/2009  . Leap 98  . Right fibula fx     age 70  . G2 p2     1 c-section, 1 NSVD    Family History  Problem Relation Age of Onset  . Hyperlipidemia Mother   . Hypertension Mother   . Colon polyps Mother   . Diabetes Maternal Grandmother     History  Substance Use Topics  . Smoking status: Former Games developer  . Smokeless tobacco: Not on file  . Alcohol Use: No    OB History    Grav Para Term Preterm Abortions TAB SAB Ect Mult Living                  Review of Systems  All other systems reviewed and are  negative.    Physical Exam  BP 125/77  Pulse 101  Temp(Src) 99.1 F (37.3 C) (Oral)  Resp 24  SpO2 97%  Physical Exam General: Well-developed, well-nourished female in no acute distress; appearance consistent with age of record HENT: normocephalic, atraumatic Eyes: pupils equal round and reactive to light; extraocular muscles intact Neck: supple Heart: regular rate and rhythm; no murmurs, rubs or gallops Lungs: Shallow respirations with decreased air movement bilaterally; expiratory wheezes. Coughing Abdomen: soft; nontender; nondistended; no masses or hepatosplenomegaly; bowel sounds present Extremities: No deformity; full range of motion; pulses normal Neurologic: Awake, alert and oriented;motor function intact in all extremities and symmetric;sensation grossly intact; no facial droop Skin: Warm and dry Psychiatric: Normal mood and affect   ED Course  Procedures  MDM Nursing notes and vitals signs, including pulse oximetry, reviewed.  Summary of this visit's results, reviewed by myself:  Labs:  Results for orders placed during the hospital encounter of 07/07/11  CBC      Component Value Range   WBC 12.1 (*) 4.0 - 10.5 (K/uL)   RBC 4.33  3.87 - 5.11 (MIL/uL)   Hemoglobin 12.9  12.0 - 15.0 (g/dL)   HCT 40.9  81.1 - 91.4 (%)   MCV 86.4  78.0 - 100.0 (fL)   MCH 29.8  26.0 -  34.0 (pg)   MCHC 34.5  30.0 - 36.0 (g/dL)   RDW 47.8  29.5 - 62.1 (%)   Platelets 304  150 - 400 (K/uL)  DIFFERENTIAL      Component Value Range   Neutrophils Relative 67  43 - 77 (%)   Neutro Abs 8.1 (*) 1.7 - 7.7 (K/uL)   Lymphocytes Relative 19  12 - 46 (%)   Lymphs Abs 2.3  0.7 - 4.0 (K/uL)   Monocytes Relative 7  3 - 12 (%)   Monocytes Absolute 0.8  0.1 - 1.0 (K/uL)   Eosinophils Relative 7 (*) 0 - 5 (%)   Eosinophils Absolute 0.9 (*) 0.0 - 0.7 (K/uL)   Basophils Relative 0  0 - 1 (%)   Basophils Absolute 0.0  0.0 - 0.1 (K/uL)  BASIC METABOLIC PANEL      Component Value Range    Sodium 139  135 - 145 (mEq/L)   Potassium 4.5  3.5 - 5.1 (mEq/L)   Chloride 100  96 - 112 (mEq/L)   CO2 27  19 - 32 (mEq/L)   Glucose, Bld 117 (*) 70 - 99 (mg/dL)   BUN 10  6 - 23 (mg/dL)   Creatinine, Ser 3.08  0.50 - 1.10 (mg/dL)   Calcium 65.7  8.4 - 10.5 (mg/dL)   GFR calc non Af Amer >60  >60 (mL/min)   GFR calc Af Amer >60  >60 (mL/min)    Imaging Studies: Dg Chest Portable 1 View  07/07/2011  *RADIOLOGY REPORT*  Clinical Data: Cough and shortness of breath.  PORTABLE CHEST - 1 VIEW  Comparison: None  Findings: The cardiac silhouette, mediastinal and hilar contours are within normal limits.  The lungs demonstrate mild bronchitic changes but no infiltrates, edema or effusions.  The bony thorax is intact.  IMPRESSION: Mild bronchitic type lung changes but no infiltrates.  Original Report Authenticated By: P. Loralie Champagne, M.D.   5:49 AM Lungs tight despite a second neb treatment. Oxygen saturation drops into the 80s when patient is sleeping. Oxygen saturation low 90s and patient awake. Will treat with moxifloxacin for bronchitis and arrange for admission to the hospitalist service.      Hanley Seamen, MD 07/07/11 [phone]

## 2011-07-07 NOTE — ED Notes (Addendum)
Patient is resting comfortably. 

## 2011-07-07 NOTE — ED Notes (Signed)
Pt states that she feels much better following her Albuterol/ Atrovent HHN. Pt is in no distress and is sitting at bedside.

## 2011-07-07 NOTE — ED Notes (Signed)
Pt sts she has had bronchitis x3 weeks. She sts she was placed on a 1 week course of antibiotics, hand held neb and cough medicine with little relief. Pt sts she has had a productive cough but does not know what color it has been. Pt c/o chest pressure and HA and has audible wheezing.

## 2011-07-08 ENCOUNTER — Inpatient Hospital Stay (HOSPITAL_COMMUNITY): Payer: 59

## 2011-07-08 DIAGNOSIS — R7309 Other abnormal glucose: Secondary | ICD-10-CM

## 2011-07-08 DIAGNOSIS — J45909 Unspecified asthma, uncomplicated: Secondary | ICD-10-CM

## 2011-07-08 LAB — CBC
HCT: 35.6 % — ABNORMAL LOW (ref 36.0–46.0)
Hemoglobin: 12 g/dL (ref 12.0–15.0)
MCV: 84.6 fL (ref 78.0–100.0)
Platelets: 308 10*3/uL (ref 150–400)
RBC: 4.21 MIL/uL (ref 3.87–5.11)
WBC: 17.9 10*3/uL — ABNORMAL HIGH (ref 4.0–10.5)

## 2011-07-08 LAB — BASIC METABOLIC PANEL
CO2: 25 mEq/L (ref 19–32)
Chloride: 103 mEq/L (ref 96–112)
Creatinine, Ser: 0.57 mg/dL (ref 0.50–1.10)
Glucose, Bld: 152 mg/dL — ABNORMAL HIGH (ref 70–99)

## 2011-07-08 LAB — HEMOGLOBIN A1C: Hgb A1c MFr Bld: 6.1 % — ABNORMAL HIGH (ref <5.7)

## 2011-07-10 ENCOUNTER — Ambulatory Visit: Payer: 59 | Admitting: Internal Medicine

## 2011-07-12 ENCOUNTER — Ambulatory Visit (INDEPENDENT_AMBULATORY_CARE_PROVIDER_SITE_OTHER): Payer: 59 | Admitting: Internal Medicine

## 2011-07-12 DIAGNOSIS — J209 Acute bronchitis, unspecified: Secondary | ICD-10-CM

## 2011-07-12 MED ORDER — BENZONATATE 100 MG PO CAPS: 100.0000 mg | ORAL_CAPSULE | Freq: Three times a day (TID) | ORAL | Status: DC | PRN

## 2011-07-12 NOTE — Progress Notes (Signed)
  Subjective:    Patient ID: [name], female    DOB: [birth year], 40 y.o.   MRN: [id number]  HPI [name] was recently hospitalized for acute asthmatic bronchitis with severe wheezing that required IV solumedrol. All hospital records reviewed. She did not have evidence of a bacterial infection - thus no antibiotics. At discharge she was on a prednisone taper and is now at 40mg  daily. She has continue to cough and does have a non-purulent sputum. She is taking robitussin DM and has been using albuterol MDI 2 puffs qid. Ovderall doing better.   Past Medical History  Diagnosis Date  . Abdominal pain, left lower quadrant 04/14/2009  . ABDOMINAL PAIN-MULTIPLE SITES 04/14/2009  . Diarrhea 04/14/2009  . DIVERTICULITIS, HX OF 12/24/2007  . DIVERTICULOSIS-COLON 07/21/2009  . HEMATURIA, HX OF 12/24/2007  . Irritable bowel syndrome 07/21/2009  . LOW BACK PAIN 12/24/2007  . URINARY INCONTINENCE 06/15/2008   Past Surgical History  Procedure Date  . Abdominal hysterectomy 06/2009  . Rectocele/cystocele repair 06/2009  . Leap 98  . Right fibula fx     age 43  . G2 p2     1 c-section, 1 NSVD   Family History  Problem Relation Age of Onset  . Hyperlipidemia Mother   . Hypertension Mother   . Colon polyps Mother   . Diabetes Maternal Grandmother    History   Social History  . Marital Status: Married    Spouse Name: N/A    Number of Children: 2  . Years of Education: 16   Occupational History  . real estate    Social History Main Topics  . Smoking status: Former Games developer  . Smokeless tobacco: Not on file  . Alcohol Use: No  . Drug Use: No  . Sexually Active: Not on file   Other Topics Concern  . Not on file   Social History Narrative   College [employer]ried 2 daughters, 2006 and goodDaily caffeine - 2       Review of Systems System review is negative for any constitutional, cardiac, pulmonary, GI or neuro symptoms or complaints     Objective:   Physical  Exam Vitals noted - stable Gen'l - WNWD woman no distress Pul - no increased work of breathing, minimal end - expiratory wheeze.  Cor - RRR      Assessment & Plan:

## 2011-07-12 NOTE — Patient Instructions (Signed)
Asthmatic bronchitis with wheezing and Shortness of breath - sound much better today. Still coughing and in part that is due to tracheal irritation. Plan - drop prednisone to 20 mg daily for 3 days. If you notice any increase in wheezing (from deep in the lungs) go back up to 30 mg daily. If you do well then drop to 10 mg daily for 6 days. Use the albuterol inhaler with aerochamber 2 puffs every 6 hours only as needed for wheezing.  Continue with the robitussin DM as long as you have a cough. Add tessalon perles 100 mg three times a day for the tracheal irritation. Call at any time for a change in symptoms.

## 2011-07-13 NOTE — Assessment & Plan Note (Signed)
[name] hadd acute bronchitis followed by persistent cough and symptoms c/w asthmatic bronchitis without evidence of persistent bacterial infection. She is doing much better at this point while on oral steroids and judicious use of a bronchodilator.  Plan - will continue to taper prednisone - to 20 mg daily for 3 days and then 10 mg x 6 days.           Albuterol MDI 2 puffs q 6 PRN and she is cautioned to call if she needs more treatment           Will continue Robitussin DM during the day and codeine cough syrup at night                      Will add tessalon perles tid for tracheal irritation and persistent cough.

## 2011-07-15 NOTE — H&P (Signed)
[name], [name]              ACCOUNT NO.:  [id number]  MEDICAL RECORD NO.:  [id number]  LOCATION:  3313                         FACILITY:  [facility]  PHYSICIAN:  Pleas Koch, MD        DATE OF BIRTH:  03-20-1971  DATE OF ADMISSION:  07/07/2011 DATE OF DISCHARGE:                             HISTORY & PHYSICAL   PRIMARY CARE PHYSICIAN:  Dr. Verita Lamb  CHIEF COMPLAINT:  Tight chest and shortness of breath.  HISTORY OF PRESENT ILLNESS:  The patient is a very pleasant 40 year old [place] female with no significant medical illnesses, who presents to [facility] with severe shortness of breath and dyspnea. She states that this all started about a month back.  She went to [place] for her 40th birthday with some girlfriends from June 06, 2011 to June 09, 2011, and then developed a cough.  She thinks that the hotel room had mildew and she started having breathing issues there.  She developed bronchitis subsequent to this and was treated with 7 days of antibiotics (presumably azithromycin) and then still had some cough. She was doing fairly well until about four days ago when her eyes were watery and the symptoms started to escalate over the past 2 days.  She attributes some of this being secondary to her neighbor cutting down couple of trees and worsening her allergy like symptoms.  Last night, she felt really wheezy, congested, had shallow breath and awoke at 1:32 this morning with classic dyspnea.  She felt comfortable only on sitting up.  She has drainage.  She has itchy eyes.  She has a normal type discharge.  She feels congested, but cannot bring up anything.  She has no radiating chest pain down her arm up into her neck, although she has pleuritic-type symptoms when she coughs.  She also has a severe headache every time she coughs.  She has no blurred vision.  No double vision and this prompted her to come to the emergency room as it was noted that she had a  fever on palpation to about 100.  At the physician's room over there at [facility], it was noted that her sats were in the 88-99% range and she was given albuterol and Atrovent and stated that she was a lot better with first one, but the second one did not make a difference.  Please note that she was trying to use albuterol about every two hourly, but without a spacer or without a delivery vehicle and this was not helping her.  PAST MEDICAL HISTORY:  Significant for exercise-induced asthma, which was diagnosed about 5 to 7 years ago when she was treated for marathon. She does have seasonal allergies and she has no strong family history of asthma or allergies per her.  PAST SURGICAL HISTORY:  She said she has had uterine prolapse with bladder tack up and subsequent hysterectomy.  She is a G2, P2, and she still has ovaries.  CURRENT MEDICATIONS: 1. Hydrocodone/homatropine 5 mL q.6 hourly. 2. Mucinex 600 mg 1 tablet twice daily. 3. Albuterol inhaler two puffs q.4 p.r.n.  SOCIAL HISTORY:  She drinks socially and does not smoke.  FAMILY HISTORY:  Her maternal grandmother had diabetes mellitus.  Her maternal grandfather had eye cancer.  Her mom has hypertension and hyperlipidemia.  There is no history of asthma.  No history of CAD.  No history of sudden death.  No history of stroke.  No history of TB.  No history of other cancer.  PHYSICAL EXAM:  GENERAL:  The patient is a very pleasant female, sitting comfortably in bed on O2 by wall oxygen. VITAL SIGNS:  GCS is 15, temperature is 98.2 orally, heart rate 97-110 in the room, this is normal sinus rhythm, blood pressure 118/73, respiratory rate is 18.  She is 96% on 2 L of nasal cannula. HEENT:  Fundus was not visualized due to lack of equipment.  She does have arcus on the top of her both eyes, unclear etiology.  She has no pallor.  She has no icterus.  She has very good dentition.  Throat is clear. NECK:  Soft, supple.   No thyromegaly. LUNGS:  She has diffuse wheezes all throughout the lungs with no tactile vocal fremitus, resonance, or any dull percussion. HEART:  S1 and S2.  Tachycardic.  Possible murmur in the right upper sternal edge. ABDOMEN:  Soft, nontender, nondistended. EXTREMITIES:  Lower extremities are soft and nontender. NEUROLOGICAL:  Grossly intact.  Smile was symmetrical.  Power 5/5 bilaterally.  PERTINENT LABS:  WBC 12.1, hemoglobin 12.9, hematocrit 37.4, platelet count 304.  Differential count add-on showed 7% neutrophils, absolute neutrophil count was 8.1, eosinophils 0.9.  Sodium 139, potassium 4.5, chloride 100, CO2 of 27, glucose 117, BUN and creatinine 10/0.60, calcium 10.3.  Chest x-ray reviewed by myself shows hyperinflation of lungs with no definitive consolidation and a patchy type appearance.  I do appreciate possible air bronchograms.  IMPRESSION/ASSESSMENT:  In summary, this is a 40 year old female with no medical illnesses, who presents with acute asthma exacerbation/status asthmaticus, which did not respond to nebs at [facility]. The patient has been given IV steroids per report at [facility] given the severity of her attack.  I would hesitate to transition her to prednisone orally at present time.  I will keep on IV Solu-Medrol at 60 mg q.8 hourly x24.  On review, her subsequent to that with Dr. Debby Bud, she will be given albuterol nebs q.2 hourly scheduled with Atrovent nebs q.6 hourly as well as Mucinex 1 p.o. b.i.d. and Robitussin 5 mL q.4 hourly p.r.n. cough.  For the pleuritic chest pain and the headache, I have requested 1 tablet p.o. p.r.n. for moderate pain not relieved by Tylenol 650.  Given her CBC and differential, I am not completely sure she has a bacterial pneumonia.  She likely has bronchitis, which could be viral and also fungal, just based on what she gives me in history.  I will try and get a sputum culture stat, but will hold  off on blood cultures and urine cultures at present time.  Empirically, we will cover her with Avelox as has been done at [facility] point.  She will be kept on the stepdown unit overnight or as per Dr. Izora Ribas instructions if seen by her earlier later today and if stabilizes could likely be transitioned over to p.o. steroids.  I have explained fully the course of care to her and her concerns are regarding whether she should see an allergist.  I will leave this up to Dr. Debby Bud to determine, but certainly I do believe she would benefit from a visit to  [facility] at least once to get spirometry done and possibly a DLCO.  It was a pleasure admitting this patient and taking care of her.          ______________________________ Pleas Koch, MD     JS/MEDQ  D:  07/07/2011  T:  07/07/2011  Job:  [id number]  cc:   Dr. Verita Lamb  Electronically Signed by Pleas Koch MD on 07/15/2011 03:56:18 PM

## 2011-07-25 NOTE — Discharge Summary (Signed)
Beth, Whitehead              ACCOUNT NO.:  [id number]  MEDICAL RECORD NO.:  [id number]  LOCATION:  3313                         FACILITY:  [facility]  PHYSICIAN:  Rosalyn Gess. Norins, MD  DATE OF BIRTH:  04-Apr-1971  DATE OF ADMISSION:  07/07/2011 DATE OF DISCHARGE:  07/09/2011                              DISCHARGE SUMMARY   ADMITTING DIAGNOSIS:  Asthmatic bronchitis with respiratory distress.  DISCHARGE DIAGNOSES: 1. Asthmatic bronchitis, improved. 2. Hyperglycemia.  CONSULTANTS:  None.  PROCEDURES:  Chest x-ray on July 07, 2011, which showed mild bronchitic-type lung changes with no infiltrates.  Followup chest x-ray on July 08, 2011, showed no evidence of acute cardiopulmonary disease.  HISTORY OF PRESENT ILLNESS:  [name] is a delightful 40 year old woman who has been generally healthy and followed in the office for long- term care.  Several weeks ago, she went to [place] for her 40th birthday party on June 06, 2011, and then developed a cough.  She subsequently was seen in the office by Dr. Oliver Barre, diagnosed with bronchitis, and treated with Avelox.  She continued to have some cough, but seemed to be doing better.  Four days prior to admission, she developed watery eyes, increasing cough, and increasing wheezing.  The patient started using her Ventolin inhaler, so she was taking treatments every 2 hours.  She continued to have significant problems with congestion and wheezing and shortness of breath.  She also developed headache.  For this reason[facility] [address] at which time she was noted to have O2 sats in the 88-99% range.  She was given albuterol and Atrovent, but then continued to be symptomatic.  Because of her persistent wheezing and shortness of breath.  She was admitted and transferred to [facility] to Step- Down Unit for closer observation and treatment.  PAST MEDICAL HISTORY:  Significant for  exercise-induced asthma.  She has seasonal allergies.  SURGICAL HISTORY:  Significant for uterine prolapse with suspension and subsequent hysterectomy.  She is gravida 2, para 2.  HOME MEDICATIONS:  Hydrocodone with homatropine for cough, Mucinex, and albuterol inhaler 2 puffs q.4.  SOCIAL HISTORY:  The patient is married.  She has 2 children at age 51 and 82; both of them are in good health.  She continues to work.  FAMILY HISTORY:  Significant for maternal grandmother and father with diabetes.  Maternal grandfather with eye cancer.  Mother with hypertension and hyperlipidemia.  Please see the H and P for admission labs and examination.  HOSPITAL COURSE:  Respiratory:  The patient with significant exacerbation of asthmatic bronchitis.  She was admitted to the hospital and started on IV Solu-Medrol.  Her initial peak flows were 200 mL before bronchodilator and 240 after.  The patient was continued on bronchodilator therapy as well as steroids, but was converted to p.o. steroids on July 08, 2011.  The patient did have a slight bump in her white count to 17,900, but no other evidence of bacterial infection with no fever, no purulent sputum.  It was thought this was related to steroid use and possible viral bronchitis.  On the day of discharge, the patient was  afebrile.  She was still coughing with scant production of a yellow to whitish mucus.  She did have some increased wheezing by her report, but did feel strong enough to go home.  PHYSICAL EXAMINATION:  VITAL SIGNS:  Temperature was 98, blood pressure 131/75, pulse 78, respirations were 14, and O2 sat was 1% on 2 liters. GENERAL APPEARANCE:  A well-nourished, well-developed woman in no acute distress. HEENT:  Conjunctivae and sclerae was clear. CHEST:  No CVA tenderness. LUNGS:  The patient does have end-expiratory wheezing, slightly worse than the day before while on oral steroids.  She has no increased work of  breathing. CARDIOVASCULAR:  2+ radial pulse.  She had a regular rate and rhythm. No further examination conducted.  FINAL LABORATORY:  Pretreatment peak flow was 360, posttreatment peak flow was 375, markedly improved from admission.  The patient's labs from July 08, 2011, with a sodium of 137, potassium 4.5, chloride 103, CO2 of 25, BUN of 15, creatinine 0.6, glucose 152, hemoglobin 12 g, white count 17,900, platelet count 308,000.  Hemoglobin A1c came back at 6.1%.  DISPOSITION:  The patient is discharged home.  No antibiotics were indicated at this time.  She will continue on steroid treatment at her current dose, 30 mg p.o. b.i.d. for the next 3 days until seen in the office.  She will use her Ventolin inhaler 2 puffs with spacer every 6 hours.  She is carefully instructed if she needs to use her inhaler more frequently than every 6 hours, she will need to be seen immediately. The patient will take Robitussin DM during the day for cough.  She will take hydrocodone, continue cough syrup at night as needed. The patient will be seen in the office for followup on Tuesday or Wednesday of next week.  She is to call for problems.  The patient will need to have pulmonary function studies when she is recovered from her acute bronchitis and is stable.  She may need to have a short period of time on a steroid bronchodilator inhaler such as Symbicort.  The patient is instructed to continue doing peak flow measurements with her personal best being 375 and if there is a significant drop she would need to notify the office immediately.  The patient's condition at time of discharge dictation is improved, but still needing treatment.     Rosalyn Gess Norins, MD     MEN/MEDQ  D:  07/09/2011  T:  07/09/2011  Job:  [id number]  Electronically Signed by Beth Regulus MD on 07/25/2011 04:57:58 PM

## 2011-09-07 LAB — CBC
HCT: 25.8 — ABNORMAL LOW
Hemoglobin: 9.1 — ABNORMAL LOW
MCHC: 34.8
MCV: 81.6
RBC: 3.17 — ABNORMAL LOW
RBC: 3.76 — ABNORMAL LOW
RDW: 14.3 — ABNORMAL HIGH
WBC: 13.8 — ABNORMAL HIGH

## 2011-09-07 LAB — COMPREHENSIVE METABOLIC PANEL
AST: 21
Albumin: 2.5 — ABNORMAL LOW
Calcium: 8.9
Chloride: 104
Creatinine, Ser: 0.54
GFR calc Af Amer: >60

## 2011-09-07 LAB — WET PREP, GENITAL
Clue Cells Wet Prep HPF POC: NONE SEEN
Trich, Wet Prep: NONE SEEN

## 2011-09-07 LAB — RPR: RPR Ser Ql: NONREACTIVE

## 2011-09-15 ENCOUNTER — Ambulatory Visit (INDEPENDENT_AMBULATORY_CARE_PROVIDER_SITE_OTHER): Payer: 59 | Admitting: Internal Medicine

## 2011-09-15 DIAGNOSIS — J309 Allergic rhinitis, unspecified: Secondary | ICD-10-CM | POA: Insufficient documentation

## 2011-09-15 NOTE — Patient Instructions (Signed)
Respiratory problems: allergic rhinitis vs asthma vs other. Doubt infection of a bacterial nature. Plan - PFTs to establish a baseline of lung function and to determine if there is any reactive airway disease, i.e. Asthma. Trial of otc antihistamine, e.g. Claritin, etc; nasal inhalational steroid - nasonex - to down regulate the underlying immune response. If there is not a good response we can consider retesting for allergy.   Allergic Rhinitis Allergic rhinitis is when the mucous membranes in the nose respond to allergens. Allergens are particles in the air that cause your body to have an allergic reaction. This causes you to release allergic antibodies. Through a chain of events, these eventually cause you to release histamine into the blood stream (hence the use of antihistamines). Although meant to be protective to the body, it is this release that causes your discomfort, such as frequent sneezing, congestion and an itchy runny nose.   CAUSES   The pollen allergens may come from grasses, trees, and weeds. This is seasonal allergic rhinitis, or "hay fever." Other allergens cause year-round allergic rhinitis (perennial allergic rhinitis) such as house dust mite allergen, pet dander and mold spores.   SYMPTOMS    Nasal stuffiness (congestion).     Runny, itchy nose with sneezing and tearing of the eyes.     There is often an itching of the mouth, eyes and ears.  It cannot be cured, but it can be controlled with medications. DIAGNOSIS   If you are unable to determine the offending allergen, skin or blood testing may find it. TREATMENT    Avoid the allergen.     Medications and allergy shots (immunotherapy) can help.     Hay fever may often be treated with antihistamines in pill or nasal spray forms. Antihistamines block the effects of histamine. There are over-the-counter medicines that may help with nasal congestion and swelling around the eyes. Check with your caregiver before taking or  giving this medicine.  If the treatment above does not work, there are many new medications your caregiver can prescribe. Stronger medications may be used if initial measures are ineffective. Desensitizing injections can be used if medications and avoidance fails. Desensitization is when a patient is given ongoing shots until the body becomes less sensitive to the allergen. Make sure you follow up with your caregiver if problems continue. SEEK MEDICAL CARE IF:    You develop fever (more than 100.5 F (38.1 C).     You develop a cough that does not stop easily (persistent).     You have shortness of breath.     You start wheezing.     Symptoms interfere with normal daily activities.  Document Released: 08/08/2001 Document Revised: 07/26/2011 Document Reviewed: 02/17/2009 [facility] Patient Information 2012 [place], [place].

## 2011-09-15 NOTE — Assessment & Plan Note (Addendum)
Symptoms are very c/w allergic rhinitis. Need to establish baseline pulmonary function and to r/o asthma  Plan - PFTs           Start meds - otc antihistamine; nasal inhalational steroid

## 2011-09-15 NOTE — Progress Notes (Signed)
  Subjective:    Patient ID: [name], female    DOB: 1971/04/29, 40 y.o.   MRN: [id number]  HPI [name] presents for follow-up of respiratory symptoms. She was recently hospitalized for asthmatic bronchitis. She reports that she has been feeling stuffed up, has nasal drainage, coughs up a yellow mucus, feels weak and run down and catches everything that comes down the pike.  She was treated for allergies in high school and was pan allergic. She has not been on an allergy medications.   I have reviewed the patient's medical history in detail and updated the computerized patient record.     Review of Systems System review is negative for any constitutional, cardiac, pulmonary, GI or neuro symptoms or complaints other than as described in the HPI.     Objective:   Physical Exam Vitals noted - excellent BP Gen'l - WNWD woman who is in no distress Pulm - normal respirations with no wheezing, increased work of breathing Cor RRR       Assessment & Plan:

## 2011-11-23 ENCOUNTER — Telehealth: Payer: Self-pay

## 2011-11-23 MED ORDER — MOMETASONE FUROATE 50 MCG/ACT NA SUSP: 2.0000 | Freq: Every day | NASAL | Status: DC

## 2011-11-23 NOTE — Telephone Encounter (Signed)
Pt called requesting refill of Nasonex. Medication not on med list, okay to add and refill?

## 2011-11-23 NOTE — Telephone Encounter (Signed)
Ok fo nasonex 2 sprays each nostril daily, refill 11

## 2011-11-30 ENCOUNTER — Telehealth: Payer: Self-pay

## 2011-11-30 NOTE — Telephone Encounter (Signed)
Pt called stating that she is using her rescue inhaler at least once daily x 3 weeks. Pt is concerned that she may need a pulmonary referral prior to PFT testing, please advise.

## 2011-12-01 NOTE — Telephone Encounter (Signed)
Her PFTs are schedule for Jan 10th. If she is not having to use rescue inhaler more than 3 times a day it will be more productive to see pulmonary after PFTs. Order put in for referral to pulmonary.

## 2011-12-01 NOTE — Telephone Encounter (Signed)
Pt advised via VM 

## 2011-12-01 NOTE — Telephone Encounter (Signed)
Left message on machine for pt to return my call  

## 2011-12-13 ENCOUNTER — Institutional Professional Consult (permissible substitution): Payer: 59 | Admitting: Internal Medicine

## 2011-12-15 ENCOUNTER — Ambulatory Visit (INDEPENDENT_AMBULATORY_CARE_PROVIDER_SITE_OTHER): Payer: 59 | Admitting: Internal Medicine

## 2011-12-15 DIAGNOSIS — R0602 Shortness of breath: Secondary | ICD-10-CM

## 2011-12-15 LAB — PULMONARY FUNCTION TEST

## 2011-12-15 NOTE — Progress Notes (Signed)
PFT done today. 

## 2011-12-22 ENCOUNTER — Ambulatory Visit (INDEPENDENT_AMBULATORY_CARE_PROVIDER_SITE_OTHER): Payer: 59 | Admitting: Internal Medicine

## 2011-12-22 ENCOUNTER — Encounter: Payer: Self-pay | Admitting: Internal Medicine

## 2011-12-22 VITALS — BP 102/68 | HR 82 | Temp 98.4°F | Ht 60.0 in | Wt 131.8 lb

## 2011-12-22 DIAGNOSIS — R0989 Other specified symptoms and signs involving the circulatory and respiratory systems: Secondary | ICD-10-CM

## 2011-12-22 DIAGNOSIS — R05 Cough: Secondary | ICD-10-CM

## 2011-12-22 DIAGNOSIS — R059 Cough, unspecified: Secondary | ICD-10-CM

## 2011-12-22 DIAGNOSIS — R0600 Dyspnea, unspecified: Secondary | ICD-10-CM

## 2011-12-22 NOTE — Patient Instructions (Signed)
Your breathing test 12/15/11 is normal So we need to proceed and rule out asthma Please have methacholine challenge test AFter you finish test, call us and inform us BAsed on result, I will have you come in or discuss having pulmonary bike stress test for your symptoms

## 2011-12-22 NOTE — Progress Notes (Signed)
Subjective:    Patient ID: [name], female    DOB: 07-24-1971, 41 y.o.   MRN: [id number]  HPI IOV 12/22/2011  Referred by Dr Debby Bud. 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 Other than that well. Then in July 2012 went to [place], [place] in July 2012 for 40th birthday (spent lot of time having fun; smoky -bars). On way back driving spent one night in a hotel that she feels had a lot of mold. And in 2 hours developed extreme dyspnea (felt like she was having a panic attack) for which albuterol had no relief. Spent night in car leaving room and in 10 min of being in car dyspnea imprved significantly. Nxt day back in [place]. Few days later still with cough, dyspnea, inability to take deep breath. Went to urgent care - Rx albuterol and anti-tussive but not better in few days. So Dr Jonny Ruiz at [facility]. Again, anti-tussive - did not help. Few days later, severe dyspnea at night. Went to ER (august 2012). States was hypoxemic and admitted. CXR clear. Discharged diagnosis was asthmatic bronchitis and took 14 days prednisone. Since then not feeling well and therefore referred.   Current symptoms: dyspnea that persists. Quality: unable to take enough air in or out. Associated sense of panic + and inability to take exercise but no paresthesia or gerd. At times of dyspnea also with dry cough but not otherwise. Rx: albuterol - now taking daily. She remembers taking xanax given by Dr Debby Bud in August 2012 but unclear if it really helped her. Relieving factors: albuterol. Aggravated by: nothing or when she is trying to lie down or sleep or even random or rarely by dust in laundry. Not triggered by pollen, dust, her 2 dogs or even exertion.   Exposure hx: till recently animal feather pillows + and 2 dogs +.   RSI  cough score 21 - Level 5 post nasal drop, coughing after lying down., breathing difficutlies, annoying cough but only when dyspneic. Level 1 clearing of throat  CXR 07/07/12: normal (Old Ct abdomen lung cut - normal) bu ? Increased bronchial markings PFT 12/15/11: Normal including DLCO  Past Medical History  Diagnosis Date  . Abdominal pain, left lower quadrant 04/14/2009  . ABDOMINAL PAIN-MULTIPLE SITES 04/14/2009  . Diarrhea 04/14/2009  . DIVERTICULITIS, HX OF 12/24/2007  . DIVERTICULOSIS-COLON 07/21/2009  . HEMATURIA, HX OF 12/24/2007  . Irritable bowel syndrome 07/21/2009  . LOW BACK PAIN 12/24/2007  . URINARY INCONTINENCE 06/15/2008     Family History  Problem Relation Age of Onset  . Hyperlipidemia Mother   . Hypertension Mother   . Colon polyps Mother   . Diabetes Maternal Grandmother      History   Social History  . Marital Status: Married    Spouse Name: N/A    Number of Children: 2  . Years of Education: 16   Occupational History  . real estate    Social History Main Topics  . Smoking status: Never Smoker   . Smokeless tobacco: Not on file   Comment: smoked socially in college  . Alcohol Use: Yes     glass of wine x 2 month  . Drug Use: No  . Sexually Active: Not on file   Other Topics Concern  . Not on file   Social  History Narrative   College ECUMarried 2 daughters, 2006 and goodDaily caffeine - 2     No Known Allergies   Outpatient Prescriptions Prior to Visit  Medication Sig Dispense Refill  . albuterol (VENTOLIN HFA) 108 (90 BASE) MCG/ACT inhaler Inhale 2 puffs into the lungs every 6 (six) hours as needed.        . mometasone (NASONEX) 50 MCG/ACT nasal spray Place 2 sprays into the nose daily.  17 g  11  . ALPRAZolam (XANAX) 0.5 MG tablet Take 0.5 mg by mouth at bedtime as needed. 1/2 tablet every 6 hours as needed for jittery nerves and trouble sleeping       . benzonatate (TESSALON PERLES) 100 MG capsule Take 1 capsule (100 mg total) by  mouth 3 (three) times daily as needed for cough.  30 capsule  1  . guaiFENesin (ROBITUSSIN) 100 MG/5ML liquid Take 10ml by mouth every 4-6 hours as needed       . HYDROcodone-homatropine (HYCODAN) 5-1.5 MG/5ML syrup Take 5 mLs by mouth every 6 (six) hours as needed.        . pantoprazole (PROTONIX) 40 MG tablet Take 40 mg by mouth daily.        . predniSONE (DELTASONE) 10 MG tablet Take 10 mg by mouth daily. 3 tablets twice a day, 4 tablets once a day tues and wens,           Review of Systems  Constitutional: Negative for fever and unexpected weight change.  HENT: Positive for congestion. Negative for ear pain, nosebleeds, sore throat, rhinorrhea, sneezing, trouble swallowing, dental problem, postnasal drip and sinus pressure.   Eyes: Negative for redness and itching.  Respiratory: Positive for cough and shortness of breath. Negative for chest tightness and wheezing.   Cardiovascular: Negative for palpitations and leg swelling.  Gastrointestinal: Negative for nausea and vomiting.  Genitourinary: Negative for dysuria.  Musculoskeletal: Negative for joint swelling.  Skin: Negative for rash.  Neurological: Positive for headaches.  Hematological: Does not bruise/bleed easily.  Psychiatric/Behavioral: Negative for dysphoric mood. The patient is not nervous/anxious.        Objective:   Physical Exam  Vitals reviewed. Constitutional: She is oriented to person, place, and time. She appears well-developed and well-nourished. No distress.  HENT:  Head: Normocephalic and atraumatic.  Right Ear: External ear normal.  Left Ear: External ear normal.  Mouth/Throat: Oropharynx is clear and moist. No oropharyngeal exudate.  Eyes: Conjunctivae and EOM are normal. Pupils are equal, round, and reactive to light. Right eye exhibits no discharge. Left eye exhibits no discharge. No scleral icterus.  Neck: Normal range of motion. Neck supple. No JVD present. No tracheal deviation present. No thyromegaly  present.  Cardiovascular: Normal rate, regular rhythm, normal heart sounds and intact distal pulses.  Exam reveals no gallop and no friction rub.   No murmur heard. Pulmonary/Chest: Effort normal and breath sounds normal. No respiratory distress. She has no wheezes. She has no rales. She exhibits no tenderness.  Abdominal: Soft. Bowel sounds are normal. She exhibits no distension and no mass. There is no tenderness. There is no rebound and no guarding.  Musculoskeletal: Normal range of motion. She exhibits no edema and no tenderness.  Lymphadenopathy:    She has no cervical adenopathy.  Neurological: She is alert and oriented to person, place, and time. She has normal reflexes. No cranial nerve deficit. She exhibits normal muscle tone. Coordination normal.  Skin: Skin is warm and dry. No rash noted. She  is not diaphoretic. No erythema. No pallor.  Psychiatric: She has a normal mood and affect. Her behavior is normal. Judgment and thought content normal.          Assessment & Plan:

## 2011-12-24 ENCOUNTER — Encounter: Payer: Self-pay | Admitting: Internal Medicine

## 2011-12-24 DIAGNOSIS — R0600 Dyspnea, unspecified: Secondary | ICD-10-CM | POA: Insufficient documentation

## 2011-12-24 DIAGNOSIS — R05 Cough: Secondary | ICD-10-CM | POA: Insufficient documentation

## 2011-12-24 NOTE — Assessment & Plan Note (Signed)
Moderate-high pretest prob for asthma. Will proceed with methacholine challenge test (safe: s/p hysterectomy and no hx of MI) and reassess. She is agreeable with plan

## 2011-12-24 NOTE — Assessment & Plan Note (Signed)
RSI score 21 suggests LPR cough but it appears it ties in with dyspnea episodes which suggests asthma. So, will rule out asthma first. If asthma test negative, then address sinus/GERD for cough. She has concerns that symptoms reflect TB or lung cancer and I have reassured her about the very low pretest probability of these 2 etiologies

## 2011-12-27 ENCOUNTER — Ambulatory Visit (HOSPITAL_COMMUNITY)
Admission: RE | Admit: 2011-12-27 | Discharge: 2011-12-27 | Disposition: A | Discharge status: Home / Self Care | Payer: 59 | Source: Ambulatory Visit | Attending: Internal Medicine | Admitting: Internal Medicine

## 2011-12-27 ENCOUNTER — Other Ambulatory Visit (HOSPITAL_COMMUNITY): Payer: Self-pay | Admitting: Radiology

## 2011-12-27 DIAGNOSIS — R0600 Dyspnea, unspecified: Secondary | ICD-10-CM

## 2011-12-27 DIAGNOSIS — R0989 Other specified symptoms and signs involving the circulatory and respiratory systems: Secondary | ICD-10-CM | POA: Insufficient documentation

## 2011-12-27 DIAGNOSIS — R0609 Other forms of dyspnea: Secondary | ICD-10-CM | POA: Insufficient documentation

## 2011-12-27 LAB — PULMONARY FUNCTION TEST

## 2011-12-27 MED ORDER — METHACHOLINE 0.25 MG/ML NEB SOLN
2.0000 mL | Freq: Once | RESPIRATORY_TRACT | Status: AC
  Administered 2011-12-27: 0.5 mg via RESPIRATORY_TRACT

## 2011-12-27 MED ORDER — METHACHOLINE 16 MG/ML NEB SOLN: 2.0000 mL | Freq: Once | RESPIRATORY_TRACT | Status: AC

## 2011-12-27 MED ORDER — SODIUM CHLORIDE 0.9 % IN NEBU
3.0000 mL | INHALATION_SOLUTION | Freq: Once | RESPIRATORY_TRACT | Status: AC
  Administered 2011-12-27: 3 mL via RESPIRATORY_TRACT

## 2011-12-27 MED ORDER — METHACHOLINE 1 MG/ML NEB SOLN: 2.0000 mL | Freq: Once | RESPIRATORY_TRACT | Status: AC

## 2011-12-27 MED ORDER — ALBUTEROL SULFATE (5 MG/ML) 0.5% IN NEBU: 2.5000 mg | INHALATION_SOLUTION | Freq: Once | RESPIRATORY_TRACT | Status: AC

## 2011-12-27 MED ORDER — METHACHOLINE 0.0625 MG/ML NEB SOLN
2.0000 mL | Freq: Once | RESPIRATORY_TRACT | Status: AC
  Administered 2011-12-27: 0.125 mg via RESPIRATORY_TRACT

## 2011-12-27 MED ORDER — METHACHOLINE 4 MG/ML NEB SOLN: 2.0000 mL | Freq: Once | RESPIRATORY_TRACT | Status: AC

## 2012-01-03 ENCOUNTER — Telehealth: Payer: Self-pay | Admitting: Internal Medicine

## 2012-01-03 NOTE — Telephone Encounter (Signed)
Received copies from Dr.Bardelas at [facility],on 01/03/12. Forwarded  6pages to Dr. Wynelle Link review.

## 2012-01-09 ENCOUNTER — Telehealth: Payer: Self-pay | Admitting: Internal Medicine

## 2012-01-09 NOTE — Telephone Encounter (Signed)
shehad methacholine challenge 12/27/11. Please get this for me. So, I Can call her with results.

## 2012-01-11 NOTE — Telephone Encounter (Signed)
Pt set for an appt on 01-24-12 at 4:30pm. Carron Curie, CMA

## 2012-01-11 NOTE — Telephone Encounter (Signed)
Called pateint to give positive methacholine challenge test result for asthma done on 12/27/11 (PD20 0.062 - 0.25). Gave result. Advised her to come into discuss. Pls call her and give appt

## 2012-01-11 NOTE — Telephone Encounter (Signed)
Methacholine received and placed in your look-at. Carron Curie, CMA

## 2012-01-24 ENCOUNTER — Encounter: Payer: Self-pay | Admitting: Internal Medicine

## 2012-01-24 ENCOUNTER — Ambulatory Visit (INDEPENDENT_AMBULATORY_CARE_PROVIDER_SITE_OTHER): Payer: 59 | Admitting: Internal Medicine

## 2012-01-24 VITALS — BP 98/70 | HR 92 | Temp 98.3°F | Ht 60.0 in | Wt 133.0 lb

## 2012-01-24 DIAGNOSIS — J45909 Unspecified asthma, uncomplicated: Secondary | ICD-10-CM

## 2012-01-24 DIAGNOSIS — J4520 Mild intermittent asthma, uncomplicated: Secondary | ICD-10-CM

## 2012-01-24 MED ORDER — ALBUTEROL SULFATE HFA 108 (90 BASE) MCG/ACT IN AERS: 2.0000 | INHALATION_SPRAY | Freq: Four times a day (QID) | RESPIRATORY_TRACT | Status: DC | PRN

## 2012-01-24 NOTE — Patient Instructions (Signed)
If you are using your albuterol more than 2 times per week call us or come; you will need controller medications Otherwise for daily intense exercise too you need controller medication but we can watch this at  Your request Take xopenex or proair sample from our office Nurse will do generic pro-air script as well Take pro-air or xopenex 15 min before all exercise Warm up and cool down real important for exercise Return as needed or call if needed

## 2012-01-24 NOTE — Progress Notes (Signed)
Subjective:    Patient ID: [name], female    DOB: 10-20-1971, 41 y.o.   MRN: [id number]  HPI  IOV 12/22/2011  Referred by Dr Debby Bud. 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 Other than that well. Then in July 2012 went to [place], [place] in July 2012 for 40th birthday (spent lot of time having fun; smoky -bars). On way back driving spent one night in a hotel that she feels had a lot of mold. And in 2 hours developed extreme dyspnea (felt like she was having a panic attack) for which albuterol had no relief. Spent night in car leaving room and in 10 min of being in car dyspnea imprved significantly. Nxt day back in [place]. Few days later still with cough, dyspnea, inability to take deep breath. Went to urgent care - Rx albuterol and anti-tussive but not better in few days. So Dr Jonny Ruiz at [facility]. Again, anti-tussive - did not help. Few days later, severe dyspnea at night. Went to ER (august 2012). States was hypoxemic and admitted. CXR clear. Discharged diagnosis was asthmatic bronchitis and took 14 days prednisone. Since then not feeling well and therefore referred.   Current symptoms: dyspnea that persists. Quality: unable to take enough air in or out. Associated sense of panic + and inability to take exercise but no paresthesia or gerd. At times of dyspnea also with dry cough but not otherwise. Rx: albuterol - now taking daily. She remembers taking xanax given by Dr Debby Bud in August 2012 but unclear if it really helped her. Relieving factors: albuterol. Aggravated by: nothing or when she is trying to lie down or sleep or even random or rarely by dust in laundry. Not triggered by pollen, dust, her 2 dogs or even exertion.   Exposure hx: till recently animal feather pillows + and 2 dogs +. RSI  cough score 21 - Level 5 post nasal drop, coughing after lying down., breathing difficutlies, annoying cough but only when dyspneic. Level 1 clearing of throat  CXR 07/07/12: normal (Old Ct abdomen lung cut - normal) bu ? Increased bronchial markings PFT 12/15/11: Normal including DLCO  OV 01/24/2012 Positive methacholine challenge test result for asthma done on 12/27/11 (PD20 0.062 - 0.25). Here to discuss results  Currently feels better. Since that time dyspnea is much improved. Albuterol use has reduced significantly. Used it today and total usage 3 times rescue since last visti. Cough also improved. Last cough was 3 weeks ago. Overall much much better. No new problems other than chest tightness /pain - happened spontaneously during working at home, lasted 1-2 days and then resolved and was tender to touch. Non-exertional. Not interested in maintenance Rx   Past, Family, Social reviewed: no change since last visit  Review of Systems  Constitutional: Positive for fever. Negative for unexpected weight change.  HENT: Positive for sore throat, trouble swallowing and postnasal drip. Negative for ear pain, nosebleeds, congestion, rhinorrhea, sneezing, dental problem and sinus pressure.   Eyes: Positive for itching. Negative for redness.  Respiratory: Positive for cough and shortness of breath. Negative for chest tightness and wheezing.   Cardiovascular: Positive for chest pain. Negative for palpitations and leg swelling.  Gastrointestinal: Positive for nausea. Negative for vomiting.  Genitourinary: Negative for dysuria.  Musculoskeletal: Negative for joint swelling.  Skin: Negative  for rash.  Neurological: Negative for headaches.  Hematological: Does not bruise/bleed easily.  Psychiatric/Behavioral: Negative for dysphoric mood. The patient is not nervous/anxious.        Objective:   Physical Exam  Vitals reviewed. Constitutional: She is oriented to person, place, and time. She appears  well-developed and well-nourished. No distress.  HENT:  Head: Normocephalic and atraumatic.  Right Ear: External ear normal.  Left Ear: External ear normal.  Mouth/Throat: Oropharynx is clear and moist. No oropharyngeal exudate.  Eyes: Conjunctivae and EOM are normal. Pupils are equal, round, and reactive to light. Right eye exhibits no discharge. Left eye exhibits no discharge. No scleral icterus.  Neck: Normal range of motion. Neck supple. No JVD present. No tracheal deviation present. No thyromegaly present.  Cardiovascular: Normal rate, regular rhythm, normal heart sounds and intact distal pulses.  Exam reveals no gallop and no friction rub.   No murmur heard. Pulmonary/Chest: Effort normal and breath sounds normal. No respiratory distress. She has no wheezes. She has no rales. She exhibits no tenderness.  Abdominal: Soft. Bowel sounds are normal. She exhibits no distension and no mass. There is no tenderness. There is no rebound and no guarding.  Musculoskeletal: Normal range of motion. She exhibits no edema and no tenderness.  Lymphadenopathy:    She has no cervical adenopathy.  Neurological: She is alert and oriented to person, place, and time. She has normal reflexes. No cranial nerve deficit. She exhibits normal muscle tone. Coordination normal.  Skin: Skin is warm and dry. No rash noted. She is not diaphoretic. No erythema. No pallor.  Psychiatric: She has a normal mood and affect. Her behavior is normal. Judgment and thought content normal.          Assessment & Plan:

## 2012-01-29 ENCOUNTER — Encounter: Payer: Self-pay | Admitting: Internal Medicine

## 2012-01-29 DIAGNOSIS — J4520 Mild intermittent asthma, uncomplicated: Secondary | ICD-10-CM | POA: Insufficient documentation

## 2012-01-29 NOTE — Assessment & Plan Note (Signed)
Currently she is improved and is in mild intermittent asthma and very well controlled. For this she does not need anything more than albuterol rescue. She plans to exercise however and exercise induced asthma does limit her. Also, warned her of sudden death or asthma attack if asthma for exercise is not addressed. At a minimum, I have advised strongly warm up, cool down (each for 10 minutes) and albuterol 10-20 min pre-exercise.  However, if she plans regular exercise she could consider controller like singulair or QVAR; currently she is not interested in controller. > 50% of this > 15 min visit spent in face to face counseling

## 2012-09-16 ENCOUNTER — Other Ambulatory Visit: Payer: Self-pay | Admitting: Obstetrics and Gynecology

## 2012-09-16 DIAGNOSIS — Z1231 Encounter for screening mammogram for malignant neoplasm of breast: Secondary | ICD-10-CM

## 2012-10-04 ENCOUNTER — Ambulatory Visit (HOSPITAL_COMMUNITY)
Admission: RE | Admit: 2012-10-04 | Discharge: 2012-10-04 | Disposition: A | Discharge status: Home / Self Care | Payer: 59 | Source: Ambulatory Visit | Attending: Obstetrics and Gynecology | Admitting: Obstetrics and Gynecology

## 2012-10-04 DIAGNOSIS — Z1231 Encounter for screening mammogram for malignant neoplasm of breast: Secondary | ICD-10-CM | POA: Insufficient documentation

## 2012-10-15 ENCOUNTER — Encounter: Payer: Self-pay | Admitting: Internal Medicine

## 2012-10-15 ENCOUNTER — Ambulatory Visit (INDEPENDENT_AMBULATORY_CARE_PROVIDER_SITE_OTHER): Payer: 59 | Admitting: Internal Medicine

## 2012-10-15 VITALS — BP 122/90 | HR 85 | Temp 97.0°F | Resp 16 | Ht 60.0 in | Wt 135.8 lb

## 2012-10-15 DIAGNOSIS — J069 Acute upper respiratory infection, unspecified: Secondary | ICD-10-CM

## 2012-10-15 MED ORDER — PREDNISONE 10 MG PO TABS: 10.0000 mg | ORAL_TABLET | Freq: Every day | ORAL | Status: DC

## 2012-10-15 MED ORDER — AZITHROMYCIN 250 MG PO TABS: ORAL_TABLET | ORAL | Status: DC

## 2012-10-15 MED ORDER — PROMETHAZINE-CODEINE 6.25-10 MG/5ML PO SYRP: 5.0000 mL | ORAL_SOLUTION | Freq: Four times a day (QID) | ORAL | Status: DC | PRN

## 2012-10-15 NOTE — Progress Notes (Signed)
Subjective:    Patient ID: [name], female    DOB: 1971/02/12, 41 y.o.   MRN: [id number]  HPI Mrs. [name] presents with a week long h/o cough, non-productive, sinus pressure headache, increased wheezing so that she is using her bronchodilator twice a day. NO fever or chills, no increased SOB. She has a sick child at home with PNA.   Past Medical History  Diagnosis Date  . Abdominal pain, left lower quadrant 04/14/2009  . ABDOMINAL PAIN-MULTIPLE SITES 04/14/2009  . Diarrhea 04/14/2009  . DIVERTICULITIS, HX OF 12/24/2007  . DIVERTICULOSIS-COLON 07/21/2009  . HEMATURIA, HX OF 12/24/2007  . Irritable bowel syndrome 07/21/2009  . LOW BACK PAIN 12/24/2007  . URINARY INCONTINENCE 06/15/2008   Past Surgical History  Procedure Date  . Abdominal hysterectomy 06/2009  . Rectocele/cystocele repair 06/2009  . Leap 98  . Right fibula fx     age 39  . G2 p2     1 c-section, 1 NSVD   Family History  Problem Relation Age of Onset  . Hyperlipidemia Mother   . Hypertension Mother   . Colon polyps Mother   . Diabetes Maternal Grandmother    History   Social History  . Marital Status: Married    Spouse Name: N/A    Number of Children: 2  . Years of Education: 16   Occupational History  . real estate    Social History Main Topics  . Smoking status: Never Smoker   . Smokeless tobacco: Not on file     Comment: smoked socially in college  . Alcohol Use: Yes     Comment: glass of wine x 2 month  . Drug Use: No  . Sexually Active: Not on file   Other Topics Concern  . Not on file   Social History Narrative   College ECUMarried 2 daughters, 2006 and goodDaily caffeine - 2    Current Outpatient Prescriptions on File Prior to Visit  Medication Sig Dispense Refill  . albuterol (PROAIR HFA) 108 (90 BASE) MCG/ACT inhaler Inhale 2 puffs into the lungs every 6 (six) hours as needed for wheezing.  1 Inhaler  3  . albuterol (VENTOLIN HFA) 108 (90 BASE) MCG/ACT inhaler Inhale 2  puffs into the lungs every 6 (six) hours as needed.        . ALPRAZolam (XANAX) 0.5 MG tablet Take 0.5 mg by mouth at bedtime as needed. 1/2 tablet every 6 hours as needed for jittery nerves and trouble sleeping       . loratadine (CLARITIN) 10 MG tablet Take 10 mg by mouth daily.      . mometasone (NASONEX) 50 MCG/ACT nasal spray Place 2 sprays into the nose daily.  17 g  11  . penicillin v potassium (VEETID) 500 MG tablet Take by mouth Daily.       Current Facility-Administered Medications on File Prior to Visit  Medication Dose Route Frequency Provider Last Rate Last Dose  . methacholine (PROVOCHOLINE) inhaler solution 2 mg  2 mL Inhalation Once Kalman Shan, MD       Followed by  . methacholine (PROVOCHOLINE) inhaler solution 8 mg  2 mL Inhalation Once Kalman Shan, MD       Followed by  . methacholine (PROVOCHOLINE) inhaler solution 32 mg  2 mL Inhalation Once Kalman Shan, MD       Followed by  . albuterol (PROVENTIL) (5 MG/ML) 0.5% nebulizer solution 2.5 mg  2.5 mg Nebulization Once Kalman Shan, MD  Review of Systems System review is negative for any constitutional, cardiac, pulmonary, GI or neuro symptoms or complaints other than as described in the HPI.     Objective:   Physical Exam Filed Vitals:   10/15/12 1653  BP: 122/90  Pulse: 85  Temp: 97 F (36.1 C)  Resp: 16   HEENT- sinus tenderness, Cor- RRR Pulm - rhonchus cough, expiratory wheeze at left base, no rale. Generalized rhonchi        Assessment & Plan:  Upper respiratory infection with sinus congestion and pain, cough and now some lung involvement with wheezing at the base of the left lung. We are getting there EARLY, thank you.  Plan  z-pak as directed - broad spectrum antibiotic  Prednisone 10 mg daily x 7 days.  Phenergan with codiene 1 tsp every six hours: may take a little less during the day for drowsiness, a little more at night if cough keeps you up.  Loratadine 10 mg  once a day - OTC  Sudafed (generic) 30 mg two or three times a day for sinus congestion  Hydrate, vitamin C 1500 mg daily, ecchinacea in one of many forms  Tylenol for any fevers or aches  Call if you have increased wheezing or shortness of breath

## 2012-10-15 NOTE — Patient Instructions (Addendum)
Upper respiratory infection with sinus congestion and pain, cough and now some lung involvement with wheezing at the base of the left lung. We are getting there EARLY, thank you.  Plan  z-pak as directed - broad spectrum antibiotic  Prednisone 10 mg daily x 7 days.  Phenergan with codiene 1 tsp every six hours: may take a little less during the day for drowsiness, a little more at night if cough keeps you up.  Loratadine 10 mg once a day - OTC  Sudafed (generic) 30 mg two or three times a day for sinus congestion  Hydrate, vitamin C 1500 mg daily, ecchinacea in one of many forms  Tylenol for any fevers or aches  Call if you have increased wheezing or shortness of breath

## 2012-10-21 ENCOUNTER — Encounter: Payer: Self-pay | Admitting: Obstetrics and Gynecology

## 2013-01-03 ENCOUNTER — Encounter: Payer: Self-pay | Admitting: Obstetrics and Gynecology

## 2013-01-08 ENCOUNTER — Encounter: Payer: Self-pay | Admitting: Obstetrics and Gynecology

## 2013-01-08 ENCOUNTER — Ambulatory Visit: Payer: 59 | Admitting: Obstetrics and Gynecology

## 2013-01-08 VITALS — BP 120/72 | HR 68 | Resp 16 | Ht 61.0 in | Wt 138.0 lb

## 2013-01-08 DIAGNOSIS — Z01419 Encounter for gynecological examination (general) (routine) without abnormal findings: Secondary | ICD-10-CM

## 2013-01-08 DIAGNOSIS — Z124 Encounter for screening for malignant neoplasm of cervix: Secondary | ICD-10-CM

## 2013-01-08 NOTE — Progress Notes (Signed)
ANNUAL GYNECOLOGIC EXAMINATION   [name] is a 42 y.o. female, G2P2002, who presents for an annual exam. The patient is status post hysterectomy, A&P repair, and TVT.  She has had constipation recently.  She has occasional urinary incontinence.  She continues to have stress with her husband who may have OCD.   History   Social History  . Marital Status: Married    Spouse Name: N/A    Number of Children: 2  . Years of Education: 16   Occupational History  . real estate    Social History Main Topics  . Smoking status: Never Smoker   . Smokeless tobacco: None     Comment: smoked socially in college  . Alcohol Use: Yes     Comment: glass of wine x 2 month  . Drug Use: No  . Sexually Active: Yes    Birth Control/ Protection: Surgical     Comment: hyst   Other Topics Concern  . None   Social History Narrative   Environmental education officer   Married 2 daughters, 2006 and 2008   Marriage good   Daily caffeine - 2    Menstrual cycle:   LMP: No LMP recorded. Patient has had a hysterectomy.             The following portions of the patient's history were reviewed and updated as appropriate: allergies, current medications, past family history, past medical history, past social history, past surgical history and problem list.  Review of Systems Pertinent items are noted in HPI. Breast:Negative for breast lump,nipple discharge or nipple retraction Gastrointestinal: Negative for abdominal pain, change in bowel habits or rectal bleeding Urinary:negative   Objective:    BP 120/72  Pulse 68  Resp 16  Ht 5\' 1"  (1.549 m)  Wt 138 lb (62.596 kg)  BMI 26.09 kg/m2    Weight:  Wt Readings from Last 1 Encounters:  01/08/13 138 lb (62.596 kg)          BMI: Body mass index is 26.09 kg/(m^2).  General Appearance: Alert, appropriate appearance for age. No acute distress HEENT: Grossly normal Neck / Thyroid: Supple, no masses, nodes or enlargement Lungs: clear to auscultation  bilaterally Back: No CVA tenderness Breast Exam: No masses or nodes.No dimpling, nipple retraction or discharge. Cardiovascular: Regular rate and rhythm. S1, S2, no murmur Gastrointestinal: Soft, non-tender, no masses or organomegaly  ++++++++++++++++++++++++++++++++++++++++++++++++++++++++  Pelvic Exam: External genitalia: normal general appearance Vaginal: normal without tenderness, induration or masses. Relaxation: Yes Cervix: absent Adnexa: normal bimanual exam Uterus:absent Rectovaginal: normal rectal, no masses  ++++++++++++++++++++++++++++++++++++++++++++++++++++++++  Lymphatic Exam: Non-palpable nodes in neck, clavicular, axillary, or inguinal regions Neurologic: Normal speech, no tremor  Psychiatric: Alert and oriented, appropriate affect.  Assessment:    Normal gyn exam   Overweight or obese: Yes   Pelvic relaxation: Yes  Urinary incontinence  Stress with her husband   Plan:    mammogram return annually or prn Contraception:no method  Counseling recommended   Medications prescribed: none  STD screen request: No   The updated Pap smear screening guidelines were discussed with the patient. The patient requested that I obtain a Pap smear: No.  Kegel exercises discussed: Yes.  Proper diet and regular exercise were reviewed.  Annual mammograms recommended starting at age 69. Proper breast care was discussed.  Screening colonoscopy is recommended beginning at age 76.  Regular health maintenance was reviewed.  Sleep hygiene was discussed.  Adequate calcium and vitamin D intake was emphasized.  Lafayette Dragon  Aston Lieske M.D.    Regular Periods: no/Hysterectomy Mammogram: yes  Monthly Breast Ex.: yes Exercise: no  Tetanus < 10 years: no Seatbelts: yes  NI. Bladder Functn.: yes Abuse at home: no  Daily BM's: yes Stressful Work: yes  Healthy Diet: yes Sigmoid-Colonoscopy: Yes  Calcium: no Medical problems this year: None   LAST PAP:10/2011  WN Contraception: Hysterectomy  Mammogram:  10/2012 WNL  PCP: Illene Regulus  PMH: DX with Asthma 05/2011  FMH: None  Last Bone Scan: None

## 2013-08-28 ENCOUNTER — Ambulatory Visit: Payer: 59 | Admitting: Internal Medicine

## 2013-09-15 ENCOUNTER — Other Ambulatory Visit: Payer: Self-pay | Admitting: Obstetrics and Gynecology

## 2013-09-15 DIAGNOSIS — N6001 Solitary cyst of right breast: Secondary | ICD-10-CM

## 2013-09-17 ENCOUNTER — Encounter: Payer: Self-pay | Admitting: Internal Medicine

## 2013-09-17 ENCOUNTER — Ambulatory Visit (INDEPENDENT_AMBULATORY_CARE_PROVIDER_SITE_OTHER): Payer: 59 | Admitting: Internal Medicine

## 2013-09-17 VITALS — BP 120/80 | HR 77 | Temp 98.3°F | Wt 137.8 lb

## 2013-09-17 DIAGNOSIS — N6452 Nipple discharge: Secondary | ICD-10-CM

## 2013-09-17 DIAGNOSIS — J029 Acute pharyngitis, unspecified: Secondary | ICD-10-CM

## 2013-09-17 DIAGNOSIS — N6459 Other signs and symptoms in breast: Secondary | ICD-10-CM

## 2013-09-18 NOTE — Progress Notes (Signed)
Subjective:    Patient ID: [name], female    DOB: May 02, 1971, 42 y.o.   MRN: [id number]  HPI [name] presents for evaluation of white material she has seen in her throat. She did have a sore throat several days ago which resolved. She has not had any fever or chills, no lymphadenopathy, no odynophagia.  She reports she has noted milkly discharge right nipple and feels a lump behind the areola. She is scheduled for diagnostic mammograms.  Past Medical History  Diagnosis Date  . Abdominal pain, left lower quadrant 04/14/2009  . ABDOMINAL PAIN-MULTIPLE SITES 04/14/2009  . Diarrhea 04/14/2009  . DIVERTICULITIS, HX OF 12/24/2007  . DIVERTICULOSIS-COLON 07/21/2009  . HEMATURIA, HX OF 12/24/2007  . Irritable bowel syndrome 07/21/2009  . LOW BACK PAIN 12/24/2007  . URINARY INCONTINENCE 06/15/2008  . Anemia   . Migraine   . Asthma    Past Surgical History  Procedure Laterality Date  . Abdominal hysterectomy  06/2009  . Rectocele/cystocele repair  06/2009  . Leap  98  . Right fibula fx      age 32  . G2 p2      1 c-section, 1 NSVD   Family History  Problem Relation Age of Onset  . Hyperlipidemia Mother   . Hypertension Mother   . Colon polyps Mother   . Diabetes Maternal Grandmother    History   Social History  . Marital Status: Married    Spouse Name: N/A    Number of Children: 2  . Years of Education: 16   Occupational History  . real estate    Social History Main Topics  . Smoking status: Never Smoker   . Smokeless tobacco: Not on file     Comment: smoked socially in college  . Alcohol Use: Yes     Comment: glass of wine x 2 month  . Drug Use: No  . Sexual Activity: Yes    Birth Control/ Protection: Surgical     Comment: hyst   Other Topics Concern  . Not on file   Social History Narrative   College [employer]   Married 2 daughters, 2006 and 2008   Marriage good   Daily caffeine - 2     Current Outpatient Prescriptions on File Prior to Visit  Medication Sig  Dispense Refill  . albuterol (VENTOLIN HFA) 108 (90 BASE) MCG/ACT inhaler Inhale 2 puffs into the lungs every 6 (six) hours as needed.        . ALPRAZolam (XANAX) 0.5 MG tablet Take 0.5 mg by mouth at bedtime as needed. 1/2 tablet every 6 hours as needed for jittery nerves and trouble sleeping       . azithromycin (ZITHROMAX) 250 MG tablet 2 tabs day #1,m tab per day #2-5  6 tablet  0  . loratadine (CLARITIN) 10 MG tablet Take 10 mg by mouth daily.      . penicillin v potassium (VEETID) 500 MG tablet Take by mouth Daily.      . predniSONE (DELTASONE) 10 MG tablet Take 1 tablet (10 mg total) by mouth daily.  7 tablet  0  . promethazine-codeine (PHENERGAN WITH CODEINE) 6.25-10 MG/5ML syrup Take 5 mLs by mouth every 6 (six) hours as needed for cough.  120 mL  1  . albuterol (PROAIR HFA) 108 (90 BASE) MCG/ACT inhaler Inhale 2 puffs into the lungs every 6 (six) hours as needed for wheezing.  1 Inhaler  3  . mometasone (NASONEX) 50 MCG/ACT nasal spray Place  2 sprays into the nose daily.  17 g  11   Current Facility-Administered Medications on File Prior to Visit  Medication Dose Route Frequency Provider Last Rate Last Dose  . methacholine (PROVOCHOLINE) inhaler solution 2 mg  2 mL Inhalation Once Kalman Shan, MD       Followed by  . methacholine (PROVOCHOLINE) inhaler solution 8 mg  2 mL Inhalation Once Kalman Shan, MD       Followed by  . methacholine (PROVOCHOLINE) inhaler solution 32 mg  2 mL Inhalation Once Kalman Shan, MD       Followed by  . albuterol (PROVENTIL) (5 MG/ML) 0.5% nebulizer solution 2.5 mg  2.5 mg Nebulization Once Kalman Shan, MD          Review of Systems System review is negative for any constitutional, cardiac, pulmonary, GI or neuro symptoms or complaints other than as described in the HPI.     Objective:   Physical Exam Filed Vitals:   09/17/13 1150  BP: 120/80  Pulse: 77  Temp: 98.3 F (36.8 C)   gen'l- WNWD healthy appearing woman HEENT  - posterior pharynx is clear. Right tonsil with scant white exudate in two crypts. Breast - no dimpling or tethering, mild discharge expressed from right nipple, no fixed mass or lesion and she is tender to exam.       Assessment & Plan:  1.Sore throat - no evidence of active infection. She is reassured.  2. Nipple discharge - except for discharge normal breast exam.  Plan  For mammography  If negative she will need to have prolactin level checked and if elevated neuro-imaging.

## 2013-09-26 ENCOUNTER — Other Ambulatory Visit: Payer: 59

## 2013-11-06 ENCOUNTER — Encounter: Payer: Self-pay | Admitting: Internal Medicine

## 2014-05-18 ENCOUNTER — Telehealth: Payer: Self-pay | Admitting: Gastroenterology

## 2014-05-18 NOTE — Telephone Encounter (Signed)
Spoke with the patient-she has a "tag" at the rectum that has been there for years, but has started being painful-appt made for 05/22/14 at 9:00am

## 2014-05-19 ENCOUNTER — Encounter: Payer: Self-pay | Admitting: *Deleted

## 2014-05-22 ENCOUNTER — Encounter: Payer: Self-pay | Admitting: Gastroenterology

## 2014-05-22 ENCOUNTER — Ambulatory Visit (INDEPENDENT_AMBULATORY_CARE_PROVIDER_SITE_OTHER): Payer: 59 | Admitting: Gastroenterology

## 2014-05-22 ENCOUNTER — Telehealth: Payer: Self-pay | Admitting: *Deleted

## 2014-05-22 VITALS — BP 124/80 | HR 70 | Ht 61.0 in | Wt 138.4 lb

## 2014-05-22 DIAGNOSIS — K649 Unspecified hemorrhoids: Secondary | ICD-10-CM | POA: Insufficient documentation

## 2014-05-22 MED ORDER — PRAMOXINE-HC 1-1 % EX CREA: TOPICAL_CREAM | CUTANEOUS | Status: DC

## 2014-05-22 NOTE — Progress Notes (Signed)
05/22/2014 [name] [id number] 1971-09-28   HISTORY OF PRESENT ILLNESS:  This is a 43 year old female who presents to our office today regarding issues with the hemorrhoid. She is previously known to Dr. Fuller Plan for colonoscopy in May 2010 at which time she was found to have moderate diverticulosis in the sigmoid and descending colon and also had internal hemorrhoids. She states that for the past several weeks she's been having issues with a hemorrhoid that is uncomfortable. She denies any significant pain and denies seeing any blood. She says that the hemorrhoid always remains external. She has used over-the-counter Preparation H with some relief. Ideally she would like to see about having it removed.   Past Medical History  Diagnosis Date  . Abdominal pain, left lower quadrant 04/14/2009  . ABDOMINAL PAIN-MULTIPLE SITES 04/14/2009  . Diarrhea 04/14/2009  . DIVERTICULITIS, HX OF 12/24/2007  . DIVERTICULOSIS-COLON 07/21/2009  . HEMATURIA, HX OF 12/24/2007  . Irritable bowel syndrome 07/21/2009  . LOW BACK PAIN 12/24/2007  . URINARY INCONTINENCE 06/15/2008  . Anemia   . Migraine   . Asthma   . Seasonal allergies   . Kidney stones    Past Surgical History  Procedure Laterality Date  . Abdominal hysterectomy  06/2009    has her ovaries  . Rectocele/cystocele repair  06/2009  . Leap  98  . Right fibula fx      age 25  . G2 p2      1 c-section, 1 NSVD  . Colonoscopy  04/19/2009    diverticulosis     reports that she has never smoked. She has never used smokeless tobacco. She reports that she drinks alcohol. She reports that she does not use illicit drugs. family history includes Colon polyps in her mother; Diabetes in her maternal grandmother; Hyperlipidemia in her mother; Hypertension in her mother. There is no history of Colon cancer. No Known Allergies    Outpatient Encounter Prescriptions as of 05/22/2014  Medication Sig  . albuterol (VENTOLIN HFA) 108 (90 BASE) MCG/ACT  inhaler Inhale 2 puffs into the lungs every 6 (six) hours as needed.    . pramoxine-hydrocortisone (ANALPRAM HC) cream Please apply to rectum 2 to 3 times daily  . [DISCONTINUED] albuterol (PROAIR HFA) 108 (90 BASE) MCG/ACT inhaler Inhale 2 puffs into the lungs every 6 (six) hours as needed for wheezing.  . [DISCONTINUED] ALPRAZolam (XANAX) 0.5 MG tablet Take 0.5 mg by mouth at bedtime as needed. 1/2 tablet every 6 hours as needed for jittery nerves and trouble sleeping   . [DISCONTINUED] azithromycin (ZITHROMAX) 250 MG tablet 2 tabs day #1,m tab per day #2-5  . [DISCONTINUED] loratadine (CLARITIN) 10 MG tablet Take 10 mg by mouth daily.  . [DISCONTINUED] mometasone (NASONEX) 50 MCG/ACT nasal spray Place 2 sprays into the nose daily.  . [DISCONTINUED] penicillin v potassium (VEETID) 500 MG tablet Take by mouth Daily.  . [DISCONTINUED] predniSONE (DELTASONE) 10 MG tablet Take 1 tablet (10 mg total) by mouth daily.  . [DISCONTINUED] promethazine-codeine (PHENERGAN WITH CODEINE) 6.25-10 MG/5ML syrup Take 5 mLs by mouth every 6 (six) hours as needed for cough.     REVIEW OF SYSTEMS  : All other systems reviewed and negative except where noted in the History of Present Illness.   PHYSICAL EXAM: BP 124/80  Pulse 70  Ht 5\' 1"  (1.549 m)  Wt 138 lb 6.4 oz (62.778 kg)  BMI 26.16 kg/m2 General: Well developed female in no acute distress Head: Normocephalic and atraumatic  Eyes:  Sclerae anicteric, conjunctiva pink. Ears: Normal auditory acuity Rectal:  External exam revealed external hemorrhoid anteriorly.   Musculoskeletal: Symmetrical with no gross deformities  Skin: No lesions on visible extremities Extremities: No edema  Neurological: Alert oriented x 4, grossly non-focal Psychological:  Alert and cooperative. Normal mood and affect  ASSESSMENT AND PLAN: -External hemorrhoid:  Non-bleeding, non-irritated.  Will treat topically with analpram cream two or three times daily, but she ideally  would like to see about having it removed.  Will send her to [facility] for evaluation.

## 2014-05-22 NOTE — Telephone Encounter (Signed)
Pt is scheduled for June 29 arrive at 3.10pm for a 3.30pm apt time with Dr Erroll Luna.[name].Marland KitchenPls let pt know   Patient notified of appointment

## 2014-05-22 NOTE — Patient Instructions (Signed)
You will receive a call from [facility] or our office regarding your appointment information for [facility].  [facility] is located at [address], [address].  If you need to contact them their number is [phone].  We have sent the following medications to your pharmacy for you to pick up at your convenience: Anal-pram Cream, please apply to rectum two to three times daily

## 2014-05-22 NOTE — Progress Notes (Signed)
Reviewed and agree with management plan.  Malcolm T. Stark, MD FACG 

## 2014-05-25 ENCOUNTER — Encounter (INDEPENDENT_AMBULATORY_CARE_PROVIDER_SITE_OTHER): Payer: Self-pay | Admitting: Surgery

## 2014-05-25 ENCOUNTER — Ambulatory Visit (INDEPENDENT_AMBULATORY_CARE_PROVIDER_SITE_OTHER): Payer: 59 | Admitting: Surgery

## 2014-05-25 VITALS — BP 118/80 | HR 86 | Temp 97.5°F | Resp 14 | Ht 60.0 in | Wt 138.6 lb

## 2014-05-25 DIAGNOSIS — K644 Residual hemorrhoidal skin tags: Secondary | ICD-10-CM

## 2014-05-25 NOTE — Patient Instructions (Signed)
Anal skin tag noted. Can be removed if wanted. Let me know if interested.

## 2014-05-25 NOTE — Progress Notes (Signed)
Patient ID: [name], female   DOB: Feb 13, 1971, 43 y.o.   MRN: [id number]  Chief Complaint  Patient presents with  . Hemorrhoids    HPI [name] is a 43 y.o. female.  Patient sent a request of Dr. Fuller Plan for anal skin tag and possible external hemorrhoid. Patient denies any rectal bleeding, pain or itching. She does have some minor irritation especially with the increasing heat. No constipation or history of prolapse or bleeding. HPI  Past Medical History  Diagnosis Date  . Abdominal pain, left lower quadrant 04/14/2009  . ABDOMINAL PAIN-MULTIPLE SITES 04/14/2009  . Diarrhea 04/14/2009  . DIVERTICULITIS, HX OF 12/24/2007  . DIVERTICULOSIS-COLON 07/21/2009  . HEMATURIA, HX OF 12/24/2007  . Irritable bowel syndrome 07/21/2009  . LOW BACK PAIN 12/24/2007  . URINARY INCONTINENCE 06/15/2008  . Anemia   . Migraine   . Asthma   . Seasonal allergies   . Kidney stones     Past Surgical History  Procedure Laterality Date  . Abdominal hysterectomy  06/2009    has her ovaries  . Rectocele/cystocele repair  06/2009  . Leap  98  . Right fibula fx      age 77  . G2 p2      1 c-section, 1 NSVD  . Colonoscopy  04/19/2009    diverticulosis     Family History  Problem Relation Age of Onset  . Hyperlipidemia Mother   . Hypertension Mother   . Colon polyps Mother   . Diabetes Maternal Grandmother   . Colon cancer Neg Hx     Social History History  Substance Use Topics  . Smoking status: Never Smoker   . Smokeless tobacco: Never Used     Comment: smoked socially in college  . Alcohol Use: Yes     Comment: glass of wine x 2 week    No Known Allergies  Current Outpatient Prescriptions  Medication Sig Dispense Refill  . albuterol (VENTOLIN HFA) 108 (90 BASE) MCG/ACT inhaler Inhale 2 puffs into the lungs every 6 (six) hours as needed.        . pramoxine-hydrocortisone (ANALPRAM HC) cream Please apply to rectum 2 to 3 times daily  30 g  1   No current facility-administered  medications for this visit.   Facility-Administered Medications Ordered in Other Visits  Medication Dose Route Frequency Provider Last Rate Last Dose  . methacholine (PROVOCHOLINE) inhaler solution 2 mg  2 mL Inhalation Once Brand Males, MD       Followed by  . methacholine (PROVOCHOLINE) inhaler solution 8 mg  2 mL Inhalation Once Brand Males, MD       Followed by  . methacholine (PROVOCHOLINE) inhaler solution 32 mg  2 mL Inhalation Once Brand Males, MD       Followed by  . albuterol (PROVENTIL) (5 MG/ML) 0.5% nebulizer solution 2.5 mg  2.5 mg Nebulization Once Brand Males, MD        Review of Systems Review of Systems  Gastrointestinal: Negative for abdominal pain, constipation, blood in stool and rectal pain.  Psychiatric/Behavioral: Negative.     Blood pressure 118/80, pulse 86, temperature 97.5 F (36.4 C), resp. rate 14, height 5' (1.524 m), weight 138 lb 9.6 oz (62.869 kg).  Physical Exam Physical Exam  Constitutional: She appears well-developed and well-nourished.  HENT:  Head: Normocephalic and atraumatic.  Pulmonary/Chest: Effort normal.  Genitourinary:       Data Reviewed none  Assessment    Anal skin tag  Plan    Discussed excision vs observation.  Risk and long term expectations discussed. She would like to observe for now.  Will call back if things change.        CORNETT,THOMAS A. 05/25/2014, 4:04 PM

## 2014-09-28 ENCOUNTER — Encounter (INDEPENDENT_AMBULATORY_CARE_PROVIDER_SITE_OTHER): Payer: Self-pay | Admitting: Surgery

## 2015-06-02 ENCOUNTER — Ambulatory Visit (INDEPENDENT_AMBULATORY_CARE_PROVIDER_SITE_OTHER): Payer: 59 | Admitting: Internal Medicine

## 2015-06-02 ENCOUNTER — Encounter: Payer: Self-pay | Admitting: Internal Medicine

## 2015-06-02 ENCOUNTER — Other Ambulatory Visit (INDEPENDENT_AMBULATORY_CARE_PROVIDER_SITE_OTHER): Payer: 59

## 2015-06-02 VITALS — BP 118/72 | HR 75 | Temp 99.1°F | Resp 16 | Ht 60.0 in | Wt 127.0 lb

## 2015-06-02 DIAGNOSIS — Z0000 Encounter for general adult medical examination without abnormal findings: Secondary | ICD-10-CM | POA: Diagnosis not present

## 2015-06-02 DIAGNOSIS — Z23 Encounter for immunization: Secondary | ICD-10-CM

## 2015-06-02 DIAGNOSIS — J4520 Mild intermittent asthma, uncomplicated: Secondary | ICD-10-CM

## 2015-06-02 LAB — COMPREHENSIVE METABOLIC PANEL
ALK PHOS: 73 U/L (ref 39–117)
ALT: 17 U/L (ref 0–35)
AST: 17 U/L (ref 0–37)
Albumin: 4.2 g/dL (ref 3.5–5.2)
BUN: 15 mg/dL (ref 6–23)
CO2: 26 mEq/L (ref 19–32)
CREATININE: 0.8 mg/dL (ref 0.40–1.20)
Calcium: 9.7 mg/dL (ref 8.4–10.5)
Chloride: 104 mEq/L (ref 96–112)
GFR: 82.82 mL/min (ref >60.00)
Glucose, Bld: 89 mg/dL (ref 70–99)
Potassium: 4.1 mEq/L (ref 3.5–5.1)
Sodium: 137 mEq/L (ref 135–145)
Total Bilirubin: 0.5 mg/dL (ref 0.2–1.2)
Total Protein: 7.7 g/dL (ref 6.0–8.3)

## 2015-06-02 LAB — CBC
HEMATOCRIT: 38.7 % (ref 36.0–46.0)
Hemoglobin: 13.1 g/dL (ref 12.0–15.0)
MCHC: 33.8 g/dL (ref 30.0–36.0)
MCV: 86.1 fl (ref 78.0–100.0)
PLATELETS: 328 10*3/uL (ref 150.0–400.0)
RBC: 4.49 Mil/uL (ref 3.87–5.11)
RDW: 13 % (ref 11.5–15.5)
WBC: 10.6 10*3/uL — AB (ref 4.0–10.5)

## 2015-06-02 LAB — LIPID PANEL
CHOL/HDL RATIO: 4
Cholesterol: 187 mg/dL (ref 0–200)
HDL: 51.1 mg/dL (ref >39.00)
LDL Cholesterol: 120 mg/dL — ABNORMAL HIGH (ref 0–99)
NonHDL: 135.9
Triglycerides: 81 mg/dL (ref 0.0–149.0)
VLDL: 16.2 mg/dL (ref 0.0–40.0)

## 2015-06-02 LAB — TSH: TSH: 0.77 u[IU]/mL (ref 0.35–4.50)

## 2015-06-02 MED ORDER — ALBUTEROL SULFATE 108 (90 BASE) MCG/ACT IN AEPB: 2.0000 | INHALATION_SPRAY | Freq: Four times a day (QID) | RESPIRATORY_TRACT | Status: DC | PRN

## 2015-06-02 NOTE — Patient Instructions (Signed)
We did not find any problems with the breast exam today.   We will check all of the blood work today and call you back with the results. We will call you even if everything is normal.   We have given you an albuterol prescription which should be free that you can fill.   Come back in 1-2 years for a check up and please feel free to call us with any problems or questions before then.  Health Maintenance Adopting a healthy lifestyle and getting preventive care can go a long way to promote health and wellness. Talk with your health care provider about what schedule of regular examinations is right for you. This is a good chance for you to check in with your provider about disease prevention and staying healthy. In between checkups, there are plenty of things you can do on your own. Experts have done a lot of research about which lifestyle changes and preventive measures are most likely to keep you healthy. Ask your health care provider for more information. WEIGHT AND DIET  Eat a healthy diet  Be sure to include plenty of vegetables, fruits, low-fat dairy products, and lean protein.  Do not eat a lot of foods high in solid fats, added sugars, or salt.  Get regular exercise. This is one of the most important things you can do for your health.  Most adults should exercise for at least 150 minutes each week. The exercise should increase your heart rate and make you sweat (moderate-intensity exercise).  Most adults should also do strengthening exercises at least twice a week. This is in addition to the moderate-intensity exercise.  Maintain a healthy weight  Body mass index (BMI) is a measurement that can be used to identify possible weight problems. It estimates body fat based on height and weight. Your health care provider can help determine your BMI and help you achieve or maintain a healthy weight.  For females 63 years of age and older:   A BMI below 18.5 is considered underweight.  A  BMI of 18.5 to 24.9 is normal.  A BMI of 25 to 29.9 is considered overweight.  A BMI of 30 and above is considered obese.  Watch levels of cholesterol and blood lipids  You should start having your blood tested for lipids and cholesterol at 44 years of age, then have this test every 5 years.  You may need to have your cholesterol levels checked more often if:  Your lipid or cholesterol levels are high.  You are older than 44 years of age.  You are at high risk for heart disease.  CANCER SCREENING   Lung Cancer  Lung cancer screening is recommended for adults 24-34 years old who are at high risk for lung cancer because of a history of smoking.  A yearly low-dose CT scan of the lungs is recommended for people who:  Currently smoke.  Have quit within the past 15 years.  Have at least a 30-pack-year history of smoking. A pack year is smoking an average of one pack of cigarettes a day for 1 year.  Yearly screening should continue until it has been 15 years since you quit.  Yearly screening should stop if you develop a health problem that would prevent you from having lung cancer treatment.  Breast Cancer  Practice breast self-awareness. This means understanding how your breasts normally appear and feel.  It also means doing regular breast self-exams. Let your health care provider know about any  changes, no matter how small.  If you are in your 20s or 30s, you should have a clinical breast exam (CBE) by a health care provider every 1-3 years as part of a regular health exam.  If you are 69 or older, have a CBE every year. Also consider having a breast X-ray (mammogram) every year.  If you have a family history of breast cancer, talk to your health care provider about genetic screening.  If you are at high risk for breast cancer, talk to your health care provider about having an MRI and a mammogram every year.  Breast cancer gene (BRCA) assessment is recommended for women  who have family members with BRCA-related cancers. BRCA-related cancers include:  Breast.  Ovarian.  Tubal.  Peritoneal cancers.  Results of the assessment will determine the need for genetic counseling and BRCA1 and BRCA2 testing. Cervical Cancer Routine pelvic examinations to screen for cervical cancer are no longer recommended for nonpregnant women who are considered low risk for cancer of the pelvic organs (ovaries, uterus, and vagina) and who do not have symptoms. A pelvic examination may be necessary if you have symptoms including those associated with pelvic infections. Ask your health care provider if a screening pelvic exam is right for you.   The Pap test is the screening test for cervical cancer for women who are considered at risk.  If you had a hysterectomy for a problem that was not cancer or a condition that could lead to cancer, then you no longer need Pap tests.  If you are older than 65 years, and you have had normal Pap tests for the past 10 years, you no longer need to have Pap tests.  If you have had past treatment for cervical cancer or a condition that could lead to cancer, you need Pap tests and screening for cancer for at least 20 years after your treatment.  If you no longer get a Pap test, assess your risk factors if they change (such as having a new sexual partner). This can affect whether you should start being screened again.  Some women have medical problems that increase their chance of getting cervical cancer. If this is the case for you, your health care provider may recommend more frequent screening and Pap tests.  The human papillomavirus (HPV) test is another test that may be used for cervical cancer screening. The HPV test looks for the virus that can cause cell changes in the cervix. The cells collected during the Pap test can be tested for HPV.  The HPV test can be used to screen women 5 years of age and older. Getting tested for HPV can extend the  interval between normal Pap tests from three to five years.  An HPV test also should be used to screen women of any age who have unclear Pap test results.  After 44 years of age, women should have HPV testing as often as Pap tests.  Colorectal Cancer  This type of cancer can be detected and often prevented.  Routine colorectal cancer screening usually begins at 44 years of age and continues through 44 years of age.  Your health care provider may recommend screening at an earlier age if you have risk factors for colon cancer.  Your health care provider may also recommend using home test kits to check for hidden blood in the stool.  A small camera at the end of a tube can be used to examine your colon directly (sigmoidoscopy or colonoscopy).  This is done to check for the earliest forms of colorectal cancer.  Routine screening usually begins at age 56.  Direct examination of the colon should be repeated every 5-10 years through 44 years of age. However, you may need to be screened more often if early forms of precancerous polyps or small growths are found. Skin Cancer  Check your skin from head to toe regularly.  Tell your health care provider about any new moles or changes in moles, especially if there is a change in a mole's shape or color.  Also tell your health care provider if you have a mole that is larger than the size of a pencil eraser.  Always use sunscreen. Apply sunscreen liberally and repeatedly throughout the day.  Protect yourself by wearing long sleeves, pants, a wide-brimmed hat, and sunglasses whenever you are outside. HEART DISEASE, DIABETES, AND HIGH BLOOD PRESSURE   Have your blood pressure checked at least every 1-2 years. High blood pressure causes heart disease and increases the risk of stroke.  If you are between 47 years and 66 years old, ask your health care provider if you should take aspirin to prevent strokes.  Have regular diabetes screenings. This  involves taking a blood sample to check your fasting blood sugar level.  If you are at a normal weight and have a low risk for diabetes, have this test once every three years after 44 years of age.  If you are overweight and have a high risk for diabetes, consider being tested at a younger age or more often. PREVENTING INFECTION  Hepatitis B  If you have a higher risk for hepatitis B, you should be screened for this virus. You are considered at high risk for hepatitis B if:  You were born in a country where hepatitis B is common. Ask your health care provider which countries are considered high risk.  Your parents were born in a high-risk country, and you have not been immunized against hepatitis B (hepatitis B vaccine).  You have HIV or AIDS.  You use needles to inject street drugs.  You live with someone who has hepatitis B.  You have had sex with someone who has hepatitis B.  You get hemodialysis treatment.  You take certain medicines for conditions, including cancer, organ transplantation, and autoimmune conditions. Hepatitis C  Blood testing is recommended for:  Everyone born from [birth year] through 1965.  Anyone with known risk factors for hepatitis C. Sexually transmitted infections (STIs)  You should be screened for sexually transmitted infections (STIs) including gonorrhea and chlamydia if:  You are sexually active and are younger than 44 years of age.  You are older than 44 years of age and your health care provider tells you that you are at risk for this type of infection.  Your sexual activity has changed since you were last screened and you are at an increased risk for chlamydia or gonorrhea. Ask your health care provider if you are at risk.  If you do not have HIV, but are at risk, it may be recommended that you take a prescription medicine daily to prevent HIV infection. This is called pre-exposure prophylaxis (PrEP). You are considered at risk if:  You are  sexually active and do not regularly use condoms or know the HIV status of your partner(s).  You take drugs by injection.  You are sexually active with a partner who has HIV. Talk with your health care provider about whether you are at high risk of  being infected with HIV. If you choose to begin PrEP, you should first be tested for HIV. You should then be tested every 3 months for as long as you are taking PrEP.  PREGNANCY   If you are premenopausal and you may become pregnant, ask your health care provider about preconception counseling.  If you may become pregnant, take 400 to 800 micrograms (mcg) of folic acid every day.  If you want to prevent pregnancy, talk to your health care provider about birth control (contraception). OSTEOPOROSIS AND MENOPAUSE   Osteoporosis is a disease in which the bones lose minerals and strength with aging. This can result in serious bone fractures. Your risk for osteoporosis can be identified using a bone density scan.  If you are 49 years of age or older, or if you are at risk for osteoporosis and fractures, ask your health care provider if you should be screened.  Ask your health care provider whether you should take a calcium or vitamin D supplement to lower your risk for osteoporosis.  Menopause may have certain physical symptoms and risks.  Hormone replacement therapy may reduce some of these symptoms and risks. Talk to your health care provider about whether hormone replacement therapy is right for you.  HOME CARE INSTRUCTIONS   Schedule regular health, dental, and eye exams.  Stay current with your immunizations.   Do not use any tobacco products including cigarettes, chewing tobacco, or electronic cigarettes.  If you are pregnant, do not drink alcohol.  If you are breastfeeding, limit how much and how often you drink alcohol.  Limit alcohol intake to no more than 1 drink per day for nonpregnant women. One drink equals 12 ounces of beer, 5  ounces of wine, or 1 ounces of hard liquor.  Do not use street drugs.  Do not share needles.  Ask your health care provider for help if you need support or information about quitting drugs.  Tell your health care provider if you often feel depressed.  Tell your health care provider if you have ever been abused or do not feel safe at home. Document Released: 05/29/2011 Document Revised: 03/30/2014 Document Reviewed: 10/15/2013 [facility] Patient Information 2015 [place], [place]. This information is not intended to replace advice given to you by your health care provider. Make sure you discuss any questions you have with your health care provider.

## 2015-06-02 NOTE — Progress Notes (Signed)
Pre visit review using our clinic review tool, if applicable. No additional management support is needed unless otherwise documented below in the visit note. 

## 2015-06-03 DIAGNOSIS — Z0000 Encounter for general adult medical examination without abnormal findings: Secondary | ICD-10-CM | POA: Insufficient documentation

## 2015-06-03 NOTE — Assessment & Plan Note (Signed)
Has not been having trouble with and inhaler is outdated. Will rx albuterol respiclick so she has rescue if needed.

## 2015-06-03 NOTE — Assessment & Plan Note (Signed)
Tdap given today. Non-smoker. Exercises regularly. Checking labs today. No family history to suggest early colon cancer screening. Up to date on mammogram. Sees gyn for pap smears (although likely does not need more since she has had hysterectomy for benign reason).

## 2015-06-03 NOTE — Progress Notes (Signed)
   Subjective:    Patient ID: [name], female    DOB: 1971/01/21, 44 y.o.   MRN: [id number]  HPI The patient is a 44 YO female coming in for wellness. No new complaints and no significant PMH. Has not used her albuterol inhaler in years and it is likely outdated.  PMH, [facility], social history reviewed and updated.   Review of Systems  Constitutional: Negative for fever, activity change, appetite change, fatigue and unexpected weight change.  HENT: Negative.   Eyes: Negative.   Respiratory: Negative for cough, chest tightness, shortness of breath and wheezing.   Cardiovascular: Negative for chest pain, palpitations and leg swelling.  Gastrointestinal: Negative for nausea, abdominal pain, diarrhea, constipation and abdominal distention.  Musculoskeletal: Negative.   Skin: Negative.   Neurological: Negative.   Psychiatric/Behavioral: Negative.       Objective:   Physical Exam  Constitutional: She is oriented to person, place, and time. She appears well-developed and well-nourished.  HENT:  Head: Normocephalic and atraumatic.  Eyes: EOM are normal.  Neck: Normal range of motion.  Cardiovascular: Normal rate and regular rhythm.   No murmur heard. Pulmonary/Chest: Effort normal and breath sounds normal.  Abdominal: Soft. She exhibits no distension. There is no tenderness. There is no rebound.  Musculoskeletal: She exhibits no edema.  Neurological: She is alert and oriented to person, place, and time. Coordination normal.  Skin: Skin is warm and dry.  Psychiatric: She has a normal mood and affect.   Filed Vitals:   06/02/15 1010  BP: 118/72  Pulse: 75  Temp: 99.1 F (37.3 C)  TempSrc: Oral  Resp: 16  Height: 5' (1.524 m)  Weight: 127 lb (57.607 kg)  SpO2: 98%      Assessment & Plan:  Tdap given today

## 2015-07-31 ENCOUNTER — Telehealth: Payer: Self-pay | Admitting: Internal Medicine

## 2015-07-31 MED ORDER — CIPROFLOXACIN HCL 500 MG PO TABS
500.0000 mg | ORAL_TABLET | Freq: Two times a day (BID) | ORAL | Status: DC
Start: 2015-07-31 — End: 2017-10-26

## 2015-07-31 MED ORDER — METRONIDAZOLE 500 MG PO TABS: 500.0000 mg | ORAL_TABLET | Freq: Three times a day (TID) | ORAL | Status: DC

## 2015-07-31 NOTE — Telephone Encounter (Signed)
Pt calls while travelling near [place], [place] feeling like she has diverticulitis Reports prior history of diverticulitis treated by Dr. Fuller Plan. Records reviewed she does have moderate left-sided diverticulosis She is having mid and lower abdominal pain which has worsened over the last 12-24 hours. Associated with nausea, no vomiting. Mild low-grade fever. Some loose stools but no rectal bleeding or melena. Tolerating liquids and soft solids My advice is as follows: Be seen in local ER if pain worsening or should she develop fever greater than 101, vomiting, inability to take by mouth's. Cipro 500 twice a day 10 days Flagyl 500 3 times a day 10 days Be seen in the office for follow-up for continuity of care when she returns home She voiced understanding

## 2015-08-04 ENCOUNTER — Ambulatory Visit: Payer: 59 | Admitting: Gastroenterology

## 2017-10-26 ENCOUNTER — Encounter: Payer: Self-pay | Admitting: Family Medicine

## 2017-10-26 ENCOUNTER — Ambulatory Visit (INDEPENDENT_AMBULATORY_CARE_PROVIDER_SITE_OTHER): Payer: BLUE CROSS/BLUE SHIELD | Admitting: Family Medicine

## 2017-10-26 ENCOUNTER — Telehealth: Payer: Self-pay | Admitting: Family Medicine

## 2017-10-26 VITALS — BP 114/70 | HR 78 | Temp 98.5°F | Ht 61.0 in | Wt 133.0 lb

## 2017-10-26 DIAGNOSIS — Z23 Encounter for immunization: Secondary | ICD-10-CM

## 2017-10-26 DIAGNOSIS — Z87448 Personal history of other diseases of urinary system: Secondary | ICD-10-CM | POA: Diagnosis not present

## 2017-10-26 DIAGNOSIS — K581 Irritable bowel syndrome with constipation: Secondary | ICD-10-CM | POA: Diagnosis not present

## 2017-10-26 DIAGNOSIS — J4520 Mild intermittent asthma, uncomplicated: Secondary | ICD-10-CM | POA: Diagnosis not present

## 2017-10-26 DIAGNOSIS — Z0000 Encounter for general adult medical examination without abnormal findings: Secondary | ICD-10-CM | POA: Diagnosis not present

## 2017-10-26 DIAGNOSIS — R3129 Other microscopic hematuria: Secondary | ICD-10-CM

## 2017-10-26 HISTORY — DX: Other microscopic hematuria: R31.29

## 2017-10-26 LAB — COMPREHENSIVE METABOLIC PANEL
ALBUMIN: 4.5 g/dL (ref 3.5–5.2)
ALT: 14 U/L (ref 0–35)
AST: 16 U/L (ref 0–37)
Alkaline Phosphatase: 75 U/L (ref 39–117)
BUN: 17 mg/dL (ref 6–23)
CALCIUM: 9.7 mg/dL (ref 8.4–10.5)
CHLORIDE: 103 meq/L (ref 96–112)
CO2: 28 meq/L (ref 19–32)
CREATININE: 0.83 mg/dL (ref 0.40–1.20)
GFR: 78.53 mL/min (ref >60.00)
Glucose, Bld: 71 mg/dL (ref 70–99)
POTASSIUM: 3.6 meq/L (ref 3.5–5.1)
Sodium: 138 mEq/L (ref 135–145)
Total Bilirubin: 0.6 mg/dL (ref 0.2–1.2)
Total Protein: 7.7 g/dL (ref 6.0–8.3)

## 2017-10-26 LAB — LIPID PANEL
CHOL/HDL RATIO: 4
CHOLESTEROL: 183 mg/dL (ref 0–200)
HDL: 51.2 mg/dL (ref >39.00)
LDL CALC: 117 mg/dL — AB (ref 0–99)
NonHDL: 132.11
TRIGLYCERIDES: 75 mg/dL (ref 0.0–149.0)
VLDL: 15 mg/dL (ref 0.0–40.0)

## 2017-10-26 NOTE — Telephone Encounter (Signed)
[name] - Can you reach out to pt to see what questions she might have please?

## 2017-10-26 NOTE — Telephone Encounter (Signed)
Copied from [place] [phone]. Topic: Quick Communication - See Telephone Encounter >> Oct 26, 2017  3:38 PM Aurelio Brash B wrote: CRM for notification. See Telephone encounter for:  Pt would like Dr Jonni Sanger to call her about tests she is running today

## 2017-10-26 NOTE — Telephone Encounter (Signed)
She was wanting to make sure that the tests that were ordered today were not duplicates from what GYN did/she looked and the GYN drew a CBC so the labs ordered today are fine/thx dmf

## 2017-10-26 NOTE — Patient Instructions (Signed)
Please return in 1 year for a complete physical; sooner if you are having more problems with your stomach pain.  It was a pleasure meeting you! Thank you for choosing [place] to meet your healthcare needs! I truly look forward to working with you.  Please do these things to maintain good health!   Exercise at least 30-45 minutes a day,  4-5 days a week.   Eat a low-fat diet with lots of fruits and vegetables, up to 7-9 servings per day.  Drink plenty of water daily. Try to drink 8 8oz glasses per day.  Seatbelts can save your life. Always wear your seatbelt.  Place Smoke Detectors on every level of your home and check batteries every year.  Schedule an appointment with an eye doctor for an eye exam every 1-2 years  Safe sex - use condoms to protect yourself from STDs if you could be exposed to these types of infections. Use birth control if you do not want to become pregnant and are sexually active.  Avoid heavy alcohol use. If you drink, keep it to less than 2 drinks/day and not every day.  [place].  Choose someone you trust that could speak for you if you became unable to speak for yourself.  Depression is common in our stressful world.If you're feeling down or losing interest in things you normally enjoy, please come in for a visit.  If anyone is threatening or hurting you, please get help. Physical or Emotional Violence is never OK.

## 2017-10-26 NOTE — Progress Notes (Signed)
Subjective  Chief Complaint  Patient presents with  . Establish Care    Patient is here today to establish care.  . Annual Exam    She is here today for a CPE. She is not fasting. She had a Hysterectomy in 2010 and was just seen by GYN last week.  She agrees to get the flu shot today.    HPI: [name] is a 46 y.o. female who presents to [place] at [facility] today for a Female Wellness Visit and to establish care.  Wellness Visit: annual visit with health maintenance review and exam without Pap   Very pleasant 47 year old female, G2 P2, with history of mild intermittent exercise-induced asthma with rare albuterol use, IBS, chronic microscopic hematuria, who lives with her female partner, working on finalizing a divorce, and her 2 daughters.  She is a Forensic psychologist and keeps very busy.  Stress levels in her life are very high.  She denies mood problems.  Over the last 3 months or so she has been having lower irritable abdominal pain without changes in bowel habits, dysuria, fevers, chills, anorexia, or weight loss.  She is seeing Dr. Raphael Gibney who was working this up for pelvic pain.  An ultrasound has been planned.  The pain is intermittent.  She also does have a history of diverticulitis but not having fevers or changes in bowel habits.  The pain is in the lower abdomen and moves around at different times.  She has no other concerns today.  I reviewed her past medical history family history social history and updated her problem list.  I reviewed records in care everywhere.  I reviewed her recent lab work from gynecology: This included a negative HIV, negative HSV-2, negative RPR, normal CBC with differential Lifestyle: Body mass index is 25.13 kg/m. Wt Readings from Last 3 Encounters:  10/26/17 133 lb (60.3 kg)  06/02/15 127 lb (57.6 kg)  05/25/14 138 lb 9.6 oz (62.9 kg)   Diet: general Exercise: rarely, no regular exercise  Patient Active Problem List     Diagnosis Date Noted  . Hematuria, microscopic 10/26/2017  . Mild intermittent asthma 01/29/2012  . Allergic rhinitis 09/15/2011  . Irritable bowel syndrome 07/21/2009   Health Maintenance  Topic Date Due  . TETANUS/TDAP  06/01/2025  . INFLUENZA VACCINE  Completed  . HIV Screening  Completed   Immunization History  Administered Date(s) Administered  . Influenza Split 08/28/2011  . Influenza,inj,Quad PF,6+ Mos 09/28/2014  . Influenza,inj,quad, With Preservative 09/15/2015  . Tdap 06/02/2015   We updated and reviewed the patient's past history in detail and it is documented below. Allergies: Patient  reports that she drinks alcohol. Past Medical History Patient  has a past medical history of Anemia, Asthma, Cesarean delivery delivered, DIVERTICULITIS, HX OF (12/24/2007), DIVERTICULOSIS-COLON (07/21/2009), HEMATURIA, HX OF (12/24/2007), Irritable bowel syndrome (07/21/2009), Kidney stones, LOW BACK PAIN (12/24/2007), Migraine, Seasonal allergies, and URINARY INCONTINENCE (06/15/2008). Past Surgical History Patient  has a past surgical history that includes Abdominal hysterectomy (06/2009); rectocele/cystocele repair (06/2009); LEAP (98); right fibula fx; G2 P2; Colonoscopy (04/19/2009); and Cesarean section. Social History   Socioeconomic History  . Marital status: Significant Other    Spouse name: None  . Number of children: 2  . Years of education: 41  . Highest education level: None  Social Needs  . Financial resource strain: None  . Food insecurity - worry: None  . Food insecurity - inability: None  . Transportation needs - medical:  None  . Transportation needs - non-medical: None  Occupational History  . Occupation: Sports coach: [employer]  Tobacco Use  . Smoking status: Never Smoker  . Smokeless tobacco: Never Used  . Tobacco comment: smoked socially in college  Substance and Sexual Activity  . Alcohol use: Yes    Comment: glass of wine x 2 week  . Drug use:  No  . Sexual activity: Yes    Birth control/protection: Surgical    Comment: hyst  Other Topics Concern  . None  Social History Narrative   [employer], 2 daughters, 2006 and 2008   Committed relationship;live together   Daily caffeine - 2   Originally from [place]   Occ alcohol,no tobacco or substance aubuse   Family History  Problem Relation Age of Onset  . Hyperlipidemia Mother   . Hypertension Mother   . Colon polyps Mother   . Diabetes Maternal Grandmother   . Healthy Daughter   . Healthy Daughter   . Colon cancer Neg Hx   . Asthma Neg Hx     Review of Systems: Constitutional: negative for fever or malaise Ophthalmic: negative for photophobia, double vision or loss of vision Cardiovascular: negative for chest pain, dyspnea on exertion, or new LE swelling Respiratory: negative for SOB or persistent cough Gastrointestinal: negative for change in bowel habits or melena Genitourinary: negative for dysuria or gross hematuria, no abnormal uterine bleeding or disharge Musculoskeletal: negative for new gait disturbance or muscular weakness Integumentary: negative for new or persistent rashes, no breast lumps Neurological: negative for TIA or stroke symptoms Psychiatric: negative for SI or delusions Allergic/Immunologic: negative for hives  Patient Care Team    Relationship Specialty Notifications Start End  Leamon Arnt, MD PCP - General Family Medicine  10/26/17   Ena Dawley, MD Consulting Physician Obstetrics and Gynecology  10/26/17     Objective  Vitals: BP 114/70 (BP Location: Right Arm, Patient Position: Sitting, Cuff Size: Normal)   Pulse 78   Temp 98.5 F (36.9 C) (Oral)   Ht 5\' 1"  (1.549 m)   Wt 133 lb (60.3 kg)   SpO2 97%   BMI 25.13 kg/m  General:  Well developed, well nourished, no acute distress  Psych:  Alert and orientedx3,normal mood and affect HEENT:  Normocephalic, atraumatic, non-icteric sclera, PERRL, oropharynx is clear without mass  or exudate, supple neck without adenopathy, mass or thyromegaly Cardiovascular:  Normal S1, S2, RRR without gallop, rub or murmur, nondisplaced PMI Respiratory:  Good breath sounds bilaterally, CTAB with normal respiratory effort Gastrointestinal: normal bowel sounds, soft, right lower quadrant-tenderness without rebound or guarding, no suprapubic tenderness, no noted masses. No HSM MSK: no deformities, contusions. Joints are without erythema or swelling. Spine and CVA region are nontender Skin:  Warm, no rashes or suspicious lesions noted Neurologic:    Mental status is normal. CN 2-11 are normal. Gross motor and sensory exams are normal. Normal gait. No tremor  Assessment  1. Annual physical exam   2. Irritable bowel syndrome with constipation   3. History of hematuria   4. Mild intermittent asthma without complication   5. Need for influenza vaccination      Plan  Female Wellness Visit:  Age appropriate Health Maintenance and Prevention measures were discussed with patient. Included topics are cancer screening recommendations, ways to keep healthy (see AVS) including dietary and exercise recommendations, regular eye and dental care, use of seat belts, and avoidance of moderate alcohol use and tobacco  use.   BMI: discussed patient's BMI and encouraged positive lifestyle modifications to help get to or maintain a target BMI.  HM needs and immunizations were addressed and ordered. See below for orders. See HM and immunization section for updates.  Flu shot updated today.  Routine labs and screening tests ordered including cmp, cbc and lipids where appropriate.  Discussed recommendations regarding Vit D and calcium supplementation (see AVS)  Lower abdominal pain most likely exacerbation of IBS.  Discussed ways to manage including stress reduction and healthy eating.  Patient will follow up with her gynecologist for further workup.  If she needs more help with this she will make a  follow-up appointment with me.  Check routine lab work  Exercise-induced asthma well controlled..  Follow up: Return in about 12 months (around 10/26/2018) for your complete physical, please come fasting.   Commons side effects, risks, benefits, and alternatives for medications and treatment plan prescribed today were discussed, and the patient expressed understanding of the given instructions. Patient is instructed to call or message via MyChart if he/she has any questions or concerns regarding our treatment plan. No barriers to understanding were identified. We discussed Red Flag symptoms and signs in detail. Patient expressed understanding regarding what to do in case of urgent or emergency type symptoms.   Medication list was reconciled, printed and provided to the patient in AVS. Patient instructions and summary information was reviewed with the patient as documented in the AVS. This note was prepared with assistance of Dragon voice recognition software. Occasional wrong-word or sound-a-like substitutions may have occurred due to the inherent limitations of voice recognition software  Orders Placed This Encounter  Procedures  . Flu Vaccine QUAD 6+ mos PF IM (Fluarix Quad PF)  . Comprehensive metabolic panel  . Lipid panel   No orders of the defined types were placed in this encounter.

## 2018-04-12 ENCOUNTER — Telehealth: Payer: Self-pay | Admitting: Family Medicine

## 2018-04-12 NOTE — Telephone Encounter (Signed)
LMOVM to inform patient that the paperwork dropped off does not require a physician to complete it. Placed in front filing cabinet for pick up.

## 2018-04-12 NOTE — Telephone Encounter (Signed)
Pt dropped off paperwork from [facility], stating that for a short period of time the practice was out of network for her insurance. The practice has since become in network again. Pt asks that the forms be reviewed, completed and faxed to [place] at the number on the forms so that the insurance will cover her visit. Placed in front bin with charge sheet.

## 2018-04-12 NOTE — Telephone Encounter (Signed)
Forms placed in box in your office.   Doloris Hall,  LPN

## 2018-04-12 NOTE — Telephone Encounter (Signed)
Forms are to be completed by patient.

## 2019-05-13 ENCOUNTER — Ambulatory Visit (INDEPENDENT_AMBULATORY_CARE_PROVIDER_SITE_OTHER): Payer: Commercial Managed Care - PPO

## 2019-05-13 ENCOUNTER — Ambulatory Visit (INDEPENDENT_AMBULATORY_CARE_PROVIDER_SITE_OTHER): Payer: Commercial Managed Care - PPO | Admitting: Family Medicine

## 2019-05-13 ENCOUNTER — Other Ambulatory Visit: Payer: Self-pay

## 2019-05-13 ENCOUNTER — Encounter: Payer: Self-pay | Admitting: Family Medicine

## 2019-05-13 VITALS — BP 126/86 | HR 72 | Temp 98.8°F | Resp 16 | Ht 61.0 in | Wt 137.2 lb

## 2019-05-13 DIAGNOSIS — R0789 Other chest pain: Secondary | ICD-10-CM

## 2019-05-13 DIAGNOSIS — R9431 Abnormal electrocardiogram [ECG] [EKG]: Secondary | ICD-10-CM | POA: Diagnosis not present

## 2019-05-13 DIAGNOSIS — Z8719 Personal history of other diseases of the digestive system: Secondary | ICD-10-CM | POA: Insufficient documentation

## 2019-05-13 LAB — LIPID PANEL
Cholesterol: 173 mg/dL (ref 0–200)
HDL: 51.4 mg/dL (ref >39.00)
LDL Cholesterol: 98 mg/dL (ref 0–99)
NonHDL: 121.2
Total CHOL/HDL Ratio: 3
Triglycerides: 115 mg/dL (ref 0.0–149.0)
VLDL: 23 mg/dL (ref 0.0–40.0)

## 2019-05-13 LAB — CBC WITH DIFFERENTIAL/PLATELET
Basophils Absolute: 0.1 10*3/uL (ref 0.0–0.1)
Basophils Relative: 0.9 % (ref 0.0–3.0)
Eosinophils Absolute: 0.5 10*3/uL (ref 0.0–0.7)
Eosinophils Relative: 5.4 % — ABNORMAL HIGH (ref 0.0–5.0)
HCT: 37.9 % (ref 36.0–46.0)
Hemoglobin: 12.8 g/dL (ref 12.0–15.0)
Lymphocytes Relative: 29.7 % (ref 12.0–46.0)
Lymphs Abs: 2.5 10*3/uL (ref 0.7–4.0)
MCHC: 33.7 g/dL (ref 30.0–36.0)
MCV: 88.2 fl (ref 78.0–100.0)
Monocytes Absolute: 0.4 10*3/uL (ref 0.1–1.0)
Monocytes Relative: 5.1 % (ref 3.0–12.0)
Neutro Abs: 4.9 10*3/uL (ref 1.4–7.7)
Neutrophils Relative %: 58.9 % (ref 43.0–77.0)
Platelets: 313 10*3/uL (ref 150.0–400.0)
RBC: 4.3 Mil/uL (ref 3.87–5.11)
RDW: 13 % (ref 11.5–15.5)
WBC: 8.4 10*3/uL (ref 4.0–10.5)

## 2019-05-13 LAB — COMPREHENSIVE METABOLIC PANEL
ALT: 17 U/L (ref 0–35)
AST: 16 U/L (ref 0–37)
Albumin: 4.3 g/dL (ref 3.5–5.2)
Alkaline Phosphatase: 70 U/L (ref 39–117)
BUN: 17 mg/dL (ref 6–23)
CO2: 26 mEq/L (ref 19–32)
Calcium: 9.4 mg/dL (ref 8.4–10.5)
Chloride: 102 mEq/L (ref 96–112)
Creatinine, Ser: 0.66 mg/dL (ref 0.40–1.20)
GFR: 95.61 mL/min (ref >60.00)
Glucose, Bld: 87 mg/dL (ref 70–99)
Potassium: 4.3 mEq/L (ref 3.5–5.1)
Sodium: 136 mEq/L (ref 135–145)
Total Bilirubin: 0.5 mg/dL (ref 0.2–1.2)
Total Protein: 6.9 g/dL (ref 6.0–8.3)

## 2019-05-13 LAB — SEDIMENTATION RATE: Sed Rate: 6 mm/hr (ref 0–20)

## 2019-05-13 LAB — TSH: TSH: 1.56 u[IU]/mL (ref 0.35–4.50)

## 2019-05-13 LAB — LIPASE: Lipase: 32 U/L (ref 11.0–59.0)

## 2019-05-13 MED ORDER — OMEPRAZOLE 20 MG PO CPDR: 20.0000 mg | DELAYED_RELEASE_CAPSULE | Freq: Every day | ORAL | 3 refills | Status: DC

## 2019-05-13 NOTE — Patient Instructions (Addendum)
I will let you know your results. Today, everything looks normal on examination. Your vital signs are perfect.  I recommend seeing a cardiologist just to be sure no further heart testing is needed given your atypical chest pain symptoms. I have placed the referral.   For now, I recommend taking omeprazole daily incase heart burn is contributing to your pain syndrome.  You may use tylenol ES twice a day as well.   If you have any questions or concerns, please don't hesitate to send me a message via MyChart or call the office at [phone]. Thank you for visiting with [place] today! It's our pleasure caring for you.   Nonspecific Chest Pain, Adult Chest pain can be caused by many different conditions. It can be caused by a condition that is life-threatening and requires treatment right away. It can also be caused by something that is not life-threatening. If you have chest pain, it can be hard to know the difference, so it is important to get help right away to make sure that you do not have a serious condition. Some life-threatening causes of chest pain include:  Heart attack.  A tear in the body's main blood vessel (aortic dissection).  Inflammation around your heart (pericarditis).  A problem in the lungs, such as a blood clot (pulmonary embolism) or a collapsed lung (pneumothorax). Some non life-threatening causes of chest pain include:  Heartburn.  Anxiety or stress.  Damage to the bones, muscles, and cartilage that make up your chest wall.  Pneumonia or bronchitis.  Shingles infection (varicella-zoster virus). Chest pain can feel like:  Pain or discomfort on the surface of your chest or deep in your chest.  Crushing, pressure, aching, or squeezing pain.  Burning or tingling.  Dull or sharp pain that is worse when you move, cough, or take a deep breath.  Pain or discomfort that is also felt in your back, neck, jaw, shoulder, or arm, or pain that spreads to any of these  areas. Your chest pain may come and go. It may also be constant. Your health care provider will do lab tests and other studies to find the cause of your pain. Treatment will depend on the cause of your chest pain. Follow these instructions at home: Medicines  Take over-the-counter and prescription medicines only as told by your health care provider.  If you were prescribed an antibiotic, take it as told by your health care provider. Do not stop taking the antibiotic even if you start to feel better. Lifestyle   Rest as directed by your health care provider.  Do not use any products that contain nicotine or tobacco, such as cigarettes and e-cigarettes. If you need help quitting, ask your health care provider.  Do not drink alcohol.  Make healthy lifestyle choices as recommended. These may include: ? Getting regular exercise. Ask your health care provider to suggest some activities that are safe for you. ? Eating a heart-healthy diet. This includes plenty of fresh fruits and vegetables, whole grains, low-fat (lean) protein, and low-fat dairy products. A dietitian can help you find healthy eating options. ? Maintaining a healthy weight. ? Managing any other health conditions you have, such as high blood pressure (hypertension) or diabetes. ? Reducing stress, such as with yoga or relaxation techniques. General instructions  Pay attention to any changes in your symptoms. Tell your health care provider about them or any new symptoms.  Avoid any activities that cause chest pain.  Keep all follow-up visits as told  by your health care provider. This is important. This includes visits for any further testing if your chest pain does not go away. Contact a health care provider if:  Your chest pain does not go away.  You feel depressed.  You have a fever. Get help right away if:  Your chest pain gets worse.  You have a cough that gets worse, or you cough up blood.  You have severe pain  in your abdomen.  You faint.  You have sudden, unexplained chest discomfort.  You have sudden, unexplained discomfort in your arms, back, neck, or jaw.  You have shortness of breath at any time.  You suddenly start to sweat, or your skin gets clammy.  You feel nausea or you vomit.  You suddenly feel lightheaded or dizzy.  You have severe weakness, or unexplained weakness or fatigue.  Your heart begins to beat quickly, or it feels like it is skipping beats. These symptoms may represent a serious problem that is an emergency. Do not wait to see if the symptoms will go away. Get medical help right away. Call your local emergency services (911 in the [place] Do not drive yourself to the hospital. Summary  Chest pain can be caused by a condition that is serious and requires urgent treatment. It may also be caused by something that is not life-threatening.  If you have chest pain, it is very important to see your health care provider. Your health care provider may do lab tests and other studies to find the cause of your pain.  Follow your health care provider's instructions on taking medicines, making lifestyle changes, and getting emergency treatment if symptoms become worse.  Keep all follow-up visits as told by your health care provider. This includes visits for any further testing if your chest pain does not go away. This information is not intended to replace advice given to you by your health care provider. Make sure you discuss any questions you have with your health care provider. Document Released: 08/23/2005 Document Revised: 05/16/2018 Document Reviewed: 05/16/2018 Elsevier Interactive Patient Education  2019 [employer].

## 2019-05-13 NOTE — Progress Notes (Addendum)
Subjective    CC:  Chief Complaint  Patient presents with  . Lump in back    Center of back.. When laying hard to breathe and painful, unable to sleep, and reports heartburn.[name] TUMS with minimal relief    HPI: [name] is a 48 y.o. female who presents to the office today to address the problems listed above in the chief complaint.  48 yo female c/o severe chest pain: occurs nightly when she lies down to go to sleep. Acute pain will prohibit her from getting comfortable. Describes a pressure sensation, sharp pain in the back, and pain with breathing. She has learned to prop her sefl up so she can get to sleep. She reports pain is the same no matter which way she lies in bed. During the day, she is completely fine. sxs started about 3-4 months ago. No worsening. No exertional sxs. Hasn't taken anything for pain. Also reports several year h/o diffuse pain: has aches and pains all the time but just ignores them. No swollen or red joints, no rashes, has never sought out help for pain. She is concerned she could have cancer.   CV risk factors: none. No diabetes, HTN, HLD, non-smoker, no FH  Has IBS but that has not been active.   Overdue for HM visit; last seen 09/2017   Assessment  1. Atypical chest pain   2. Abnormal EKG      Plan   Atypical chest pain:  Unclear etiology; given chronic pain complaints, ? Fibromyalgia but need to evaluate for other causes first: cxr, EKG, cards evaluation and lab work to start. Trial of PPI to see if GERD related since happens when she lies back at night. Pericarditis but typically wouldn't last so long or be so intermittent. Will f/u by phone after results are back.   Needs CPE.   Follow up: Return for complete physical.  Visit date not found  Orders Placed This Encounter  Procedures  . DG Chest 2 View  . CBC with Differential/Platelet  . Sedimentation rate  . Comprehensive metabolic panel  . TSH  . Lipid panel  . H. pylori  antibody, IgG  . Lipase  . Ambulatory referral to Cardiology  . EKG 12-Lead   Meds ordered this encounter  Medications  . omeprazole (PRILOSEC) 20 MG capsule    Sig: Take 1 capsule (20 mg total) by mouth daily.    Dispense:  30 capsule    Refill:  3      I reviewed the patients updated PMH, FH, and SocHx.    Patient Active Problem List   Diagnosis Date Noted  . History of diverticulitis 05/13/2019  . Hematuria, microscopic 10/26/2017  . Mild intermittent asthma 01/29/2012  . Allergic rhinitis 09/15/2011  . Irritable bowel syndrome 07/21/2009   No outpatient medications have been marked as taking for the 05/13/19 encounter (Office Visit) with Leamon Arnt, MD.    Allergies: Patient has No Known Allergies. Family History: Patient family history includes Colon polyps in her mother; Diabetes in her maternal grandmother; Healthy in her daughter and daughter; Hyperlipidemia in her mother; Hypertension in her mother. Social History:  Patient  reports that she has never smoked. She has never used smokeless tobacco. She reports current alcohol use. She reports that she does not use drugs.  Review of Systems: Constitutional: Negative for fever malaise or anorexia Cardiovascular: negative for LE edema. Orthopnea, pnd Respiratory: negative for SOB or persistent cough Gastrointestinal: negative for abdominal  pain  Objective  Vitals: BP 126/86   Pulse 72   Temp 98.8 F (37.1 C) (Oral)   Resp 16   Ht 5\' 1"  (1.549 m)   Wt 137 lb 3.2 oz (62.2 kg)   SpO2 98%   BMI 25.92 kg/m  General: no acute distress , A&Ox3 but teaful at times HEENT: PEERL, conjunctiva normal, Oropharynx moist,neck is supple Cardiovascular:  RRR without murmur or gallop. No peripheral edema Respiratory:  Good breath sounds bilaterally, CTAB with normal respiratory effort, no chest wall ttp Back:normal appearing. No rash. nontender spine. No masses appreciated Gastrointestinal: soft, flat abdomen, normal  active bowel sounds, no palpable masses, no hepatosplenomegaly, no appreciated hernias, ttp with light touch at first but then nontender.  Skin:  Warm, no rashes  Ekg: q in lead 3, poor R wave porgression, nonspecific t wave changes. No comparison   Commons side effects, risks, benefits, and alternatives for medications and treatment plan prescribed today were discussed, and the patient expressed understanding of the given instructions. Patient is instructed to call or message via MyChart if he/she has any questions or concerns regarding our treatment plan. No barriers to understanding were identified. We discussed Red Flag symptoms and signs in detail. Patient expressed understanding regarding what to do in case of urgent or emergency type symptoms.   Medication list was reconciled, printed and provided to the patient in AVS. Patient instructions and summary information was reviewed with the patient as documented in the AVS. This note was prepared with assistance of Dragon voice recognition software. Occasional wrong-word or sound-a-like substitutions may have occurred due to the inherent limitations of voice recognition software

## 2019-05-14 LAB — H. PYLORI ANTIBODY, IGG: H Pylori IgG: NEGATIVE

## 2019-05-19 ENCOUNTER — Encounter: Payer: Self-pay | Admitting: Family Medicine

## 2019-06-02 ENCOUNTER — Telehealth: Payer: Self-pay

## 2019-06-02 NOTE — Telephone Encounter (Signed)

## 2019-06-02 NOTE — Telephone Encounter (Signed)
Called pt to change appointment to virtual visit or move to another day. Left message to call back.

## 2019-06-03 ENCOUNTER — Telehealth: Payer: Self-pay | Admitting: Cardiology

## 2019-06-03 NOTE — Telephone Encounter (Signed)
I called pt to confirm her appt for 06-04-19 with Dr Harrell Gave.

## 2019-06-04 ENCOUNTER — Encounter: Payer: Self-pay | Admitting: Cardiology

## 2019-06-04 ENCOUNTER — Telehealth (INDEPENDENT_AMBULATORY_CARE_PROVIDER_SITE_OTHER): Payer: Commercial Managed Care - PPO | Admitting: Cardiology

## 2019-06-04 VITALS — Wt 135.1 lb

## 2019-06-04 DIAGNOSIS — Z713 Dietary counseling and surveillance: Secondary | ICD-10-CM

## 2019-06-04 DIAGNOSIS — R072 Precordial pain: Secondary | ICD-10-CM | POA: Diagnosis not present

## 2019-06-04 DIAGNOSIS — Z7189 Other specified counseling: Secondary | ICD-10-CM

## 2019-06-04 NOTE — Patient Instructions (Signed)

## 2019-06-04 NOTE — Progress Notes (Signed)
Virtual Visit via Video Note   This visit type was conducted due to national recommendations for restrictions regarding the COVID-19 Pandemic (e.g. social distancing) in an effort to limit this patient's exposure and mitigate transmission in our community.  Due to her co-morbid illnesses, this patient is at least at moderate risk for complications without adequate follow up.  This format is felt to be most appropriate for this patient at this time.  All issues noted in this document were discussed and addressed.  A limited physical exam was performed with this format.  Please refer to the patient's chart for her consent to telehealth for [facility].   Date:  06/04/2019   ID:  [name], DOB 1971/09/06, MRN [id number]  Patient Location: Home Provider Location: Home  PCP:  Leamon Arnt, MD  Cardiologist:  Buford Dresser, MD  Electrophysiologist:  None   Evaluation Performed:  Consultation - Fransisco Beau was referred by Dr, Jonni Sanger for the evaluation of chest pain.  Chief Complaint:  Chest pain  History of Present Illness:    [name] is a 48 y.o. female without prior PMH of cardiac disease. She is seen via virtual visit at the request of Dr. Billey Chang for the evaluation and management of chest pain.  The patient does not have symptoms concerning for COVID-19 infection (fever, chills, cough, or new shortness of breath).   Chest pain: -Initial onset: for the last three months, when she lays down at night, feels a lot of pressure in her chest. On the side of her left breast, hurts worse when she repositions. Has to prop herself up at night to feel that she is not uncomfortable. Both shoulders have been hurting for the last two years, left great than right (large dog, pulled her shoulder severely while walking him distantly).   Has once had severe pain in chest when cleaning the house, almost called 911, radiated down arm, felt a hard time breathing. Happened 3  mos ago, took about 20 minutes to pass after she stopped.  -Quality: Generally whole body aches, both morning and night. Was worst during her divorce, somewhat improving. Not tender to palpation, feels like a deep ache -Frequency:only one severe episode, but body aches daily -Duration: one severe episode  -Associated symptoms: severe episode had shortness of  -Aggravating/alleviating factors: -Prior cardiac history: told she had a heart murmur as a child but told not to worry about it -Prior ECG: reviewed  -Prior workup: none -Prior treatment: none -Alcohol: occasional drink -Tobacco: none -Comorbidities: lots of stress, works as a Cabin crew, went through stressful divorce 4 years ago (now remarried) -Exercise level: runs 4-5 times/mo for 3 miles, no symptoms while running -Cardiac ROS: no shortness of breath, no PND, no orthopnea, no LE edema, no syncope -Family history: no history of heart disease or stroke. Doesn't know biological dad.   Sleeping at night: feels like she is suffocating, heavy pressure. Sometimes wakes up with numbness in her arms, improves with changing position. No known apnea.   Past Medical History:  Diagnosis Date  . Anemia   . Asthma   . Cesarean delivery delivered   . DIVERTICULITIS, HX OF 12/24/2007  . DIVERTICULOSIS-COLON 07/21/2009  . Hematuria, microscopic 10/26/2017   Chronic, since teens; never cystoscopy.  . Irritable bowel syndrome 07/21/2009  . Kidney stones   . Migraine   . Seasonal allergies   . URINARY INCONTINENCE 06/15/2008   Past Surgical History:  Procedure Laterality Date  .  ABDOMINAL HYSTERECTOMY  06/2009   has her ovaries  . CESAREAN SECTION    . COLONOSCOPY  04/19/2009   diverticulosis   . G2 P2     1 c-section, 1 NSVD  . LEAP  98  . rectocele/cystocele repair  06/2009  . right fibula fx     age 56     No outpatient medications have been marked as taking for the 06/04/19 encounter (Telemedicine) with Buford Dresser, MD.      Allergies:   Patient has no known allergies.   Social History   Tobacco Use  . Smoking status: Never Smoker  . Smokeless tobacco: Never Used  . Tobacco comment: smoked socially in college  Substance Use Topics  . Alcohol use: Yes    Comment: glass of wine x 2 week  . Drug use: No     Family Hx: The patient's family history includes Colon polyps in her mother; Diabetes in her maternal grandmother; Healthy in her daughter and daughter; Hyperlipidemia in her mother; Hypertension in her mother. There is no history of Colon cancer or Asthma.  ROS:   Please see the history of present illness.    Constitutional: Negative for chills, fever, night sweats, unintentional weight loss  HENT: Negative for ear pain and hearing loss.   Eyes: Negative for loss of vision and eye pain.  Respiratory: Negative for cough, sputum, wheezing.   Cardiovascular: See HPI. Gastrointestinal: Negative for abdominal pain, melena, and hematochezia.  Genitourinary: Negative for dysuria and hematuria.  Musculoskeletal: Negative for falls and myalgias.  Skin: Negative for itching and rash.  Neurological: Negative for focal weakness, focal sensory changes and loss of consciousness.  Endo/Heme/Allergies: Does not bruise/bleed easily.  All other systems reviewed and are negative.   Prior CV studies:   The following studies were reviewed today: No prior cardiac studies  Labs/Other Tests and Data Reviewed:    EKG:  An ECG dated 05/13/19 was personally reviewed today and demonstrated:  NSR. borderline conduction abnormality  Recent Labs: 05/13/2019: ALT 17; BUN 17; Creatinine, Ser 0.66; Hemoglobin 12.8; Platelets 313.0; Potassium 4.3; Sodium 136; TSH 1.56   Recent Lipid Panel Lab Results  Component Value Date/Time   CHOL 173 05/13/2019 09:55 AM   TRIG 115.0 05/13/2019 09:55 AM   HDL 51.40 05/13/2019 09:55 AM   CHOLHDL 3 05/13/2019 09:55 AM   LDLCALC 98 05/13/2019 09:55 AM   LDLDIRECT 134.5 06/10/2008  12:55 PM    Wt Readings from Last 3 Encounters:  06/04/19 135 lb 1.6 oz (61.3 kg)  05/13/19 137 lb 3.2 oz (62.2 kg)  10/26/17 133 lb (60.3 kg)     Objective:    Vital Signs:  Wt 135 lb 1.6 oz (61.3 kg)   BMI 25.53 kg/m    VITAL SIGNS:  reviewed GEN:  no acute distress EYES:  sclerae anicteric, EOMI - Extraocular Movements Intact RESPIRATORY:  normal respiratory effort, symmetric expansion CARDIOVASCULAR:  no visible JVD SKIN:  no rash, lesions or ulcers. MUSCULOSKELETAL:  no obvious deformities. NEURO:  alert and oriented x 3, no obvious focal deficit PSYCH:  normal affect  ASSESSMENT & PLAN:    Chest pain: her nighttime symptoms, which are what most concern her, are atypical for cardiac pain. However, I am concerned by the episode she had while cleaning house 3 mos ago. We discussed options at length, including treadmill stress test, lexiscan nuclear stress, and coronary CTA. We discussed risk and benefit of each test at length. I recommended that the most  definitive test is coronary CTA, as it gives anatomy and also gives predictive value if negative. She is less concerned re: the episode several months ago and would prefer watchful waiting. -given instructions on red flag warning symptoms. I instructed that if she has an episode similar to what she had prior, she should present to ER. With high sensitivity troponin testing, we can determine if it was MI even if her symptoms have subsided -she wants to try GI treatment to see if it helps her symptoms. Started on PPI by PCP -we will plan to follow up in 3 mos, in office w/EKG -will call if symptoms changes so that testing can be ordered/follow up sooner  CV risk assessment and prevention counseling -recommend heart healthy/Mediterranean diet, with whole grains, fruits, vegetable, fish, lean meats, nuts, and olive oil. Limit salt. -recommend moderate walking, 3-5 times/week for 30-50 minutes each session. Aim for at least 150  minutes.week. Goal should be pace of 3 miles/hours, or walking 1.5 miles in 30 minutes -recommend avoidance of tobacco products. Avoid excess alcohol. -ASCVD risk score: The 10-year ASCVD risk score Mikey Bussing DC Brooke Bonito., et al., 2013) is: 0.8%   Values used to calculate the score:     Age: 13 years     Sex: Female     Is Non-Hispanic African American: No     Diabetic: No     Tobacco smoker: No     Systolic Blood Pressure: 182 mmHg     Is BP treated: No     HDL Cholesterol: 51.4 mg/dL     Total Cholesterol: 173 mg/dL   COVID-19 Education: The signs and symptoms of COVID-19 were discussed with the patient and how to seek care for testing (follow up with PCP or arrange E-visit).  The importance of social distancing was discussed today.  Time:   Today, I have spent 41 minutes with the patient with telehealth technology discussing the above problems.     Medication Adjustments/Labs and Tests Ordered: Current medicines are reviewed at length with the patient today.  Concerns regarding medicines are outlined above.   Patient Instructions  Medication Instructions:  Your Physician recommend you continue on your current medication as directed.    If you need a refill on your cardiac medications before your next appointment, please call your pharmacy.   Lab work: None  Testing/Procedures: None  Follow-Up: At [facility], you and your health needs are our priority.  As part of our continuing mission to provide you with exceptional heart care, we have created designated Provider Care Teams.  These Care Teams include your primary Cardiologist (physician) and Advanced Practice Providers (APPs -  Physician Assistants and Nurse Practitioners) who all work together to provide you with the care you need, when you need it. You will need a follow up appointment in 3 months.  Please call our office 2 months in advance to schedule this appointment.  You may see Buford Dresser, MD or one of the  following Advanced Practice Providers on your designated Care Team:   Rosaria Ferries, PA-C . Jory Sims, DNP, ANP       Signed, Buford Dresser, MD  06/04/2019 11:54 AM    [place]

## 2019-08-05 ENCOUNTER — Telehealth: Payer: Self-pay | Admitting: Family Medicine

## 2019-08-05 NOTE — Telephone Encounter (Signed)
Hello,   The documentation supports the level of service billed and the EKG charge. This visit was not scheduled as a cpe and per Dr. Tamela Oddi note, CPE was past due, but this visit was only for acute complaints according to documentation.   Hope this helps, let me know if questions. Thanks, [employer]

## 2019-08-05 NOTE — Telephone Encounter (Signed)
Thank you Dawn for looking into this for [place] and providing an update.   Lea please follow up with the patient when possible as she wanted you personally to follow up.

## 2019-08-05 NOTE — Telephone Encounter (Signed)
Please advise.   Copied from [place] [phone]. Topic: General - Other >> Aug 05, 2019 11:16 AM Yvette Rack wrote: Reason for CRM: Pt requests to speak with practice administrator regarding billing and coding for appt on 05/13/19. Pt stated she has a problem with how the coding was done and she would like the practice administrator to call her back to discuss. >> Aug 05, 2019 12:11 PM Carole Binning B wrote: I called the patient to follow up and get more insight on what took place as Modena Nunnery is out of the office today as she can follow up.   The patient stated that she got an EKG and Dr. Jonni Sanger said they haven't seen her in over 1 year and needed to do a complete blood panel as well. The patient stated that she was unsure if that was necessary for the visit as it was supposed to be an annual visit an chest pain which ended up being heartburn per the patient per Dr. Jonni Sanger.   The patient stated that the clinical team came in and told her that she was being sent to cardiology. Dr. Jonni Sanger never returned into the room to follow up with her personally. She asked why she was going to cardiologist and was told by the clinical team that they were not sure however, they would check with Dr. Jonni Sanger. She said the clinical team stepped out and asked Dr. Jonni Sanger and said that it was necessary for her due to her EKG. The patient said that she was uncomfortable with this as Dr. Jonni Sanger did not follow up and she thought she was healthy.  She gets a free visit for her annual yet the bill was $800. She was not pleased with not knowing who was shadowing the EKG and how Dr. Jonni Sanger handled the visit. Patient stated she wanted clarity from Dr. Jonni Sanger on next steps and why.  Patient requested that she would like the coding to be done differently to be covered.   I checked into the admin side of this and it appears everything was done correctly on our end with registration. Unsure about the coding portion. I have included Dawn on this also.   Patient  appreciated my call and requests to speak to Lea.

## 2019-09-04 ENCOUNTER — Ambulatory Visit: Payer: Commercial Managed Care - PPO | Admitting: Cardiology

## 2019-12-08 LAB — HM MAMMOGRAPHY

## 2019-12-15 ENCOUNTER — Encounter: Payer: Self-pay | Admitting: Family Medicine

## 2020-05-11 ENCOUNTER — Other Ambulatory Visit: Payer: Self-pay

## 2020-05-12 ENCOUNTER — Ambulatory Visit: Payer: Commercial Managed Care - PPO | Admitting: Physician Assistant

## 2020-05-12 ENCOUNTER — Other Ambulatory Visit: Payer: Self-pay

## 2020-05-12 ENCOUNTER — Encounter: Payer: Self-pay | Admitting: Physician Assistant

## 2020-05-12 VITALS — BP 130/74 | HR 84 | Temp 97.3°F | Ht 61.0 in | Wt 137.5 lb

## 2020-05-12 DIAGNOSIS — R102 Pelvic and perineal pain: Secondary | ICD-10-CM | POA: Diagnosis not present

## 2020-05-12 DIAGNOSIS — K5792 Diverticulitis of intestine, part unspecified, without perforation or abscess without bleeding: Secondary | ICD-10-CM | POA: Diagnosis not present

## 2020-05-12 MED ORDER — AMOXICILLIN-POT CLAVULANATE 875-125 MG PO TABS
1.0000 | ORAL_TABLET | Freq: Two times a day (BID) | ORAL | 0 refills | Status: DC
Start: 2020-05-12 — End: 2020-08-18

## 2020-05-12 NOTE — Progress Notes (Signed)
[name] is a 49 y.o. female here for a follow up of a pre-existing problem.  I acted as a Education administrator for [employer], PA-C Anselmo Pickler, LPN   History of Present Illness:   Chief Complaint  Patient presents with  . Abdominal Pain    HPI   Abdominal pain Pt c/o lower abdominal pain since Monday. Pt having frequent loose stools, painful. Denies blood in stool. Feverish Monday night. Having diverticulosis flare. Using Hyoscyamine with some relief. She has been limiting herself to a bland/liquid foods.  Has known diverticula in the sigmoid colon to the descending colon. Has had about 5 flares in her lifetime. Current symptoms are her normal flare symptoms.  Pelvic pain Hx of rectocele/cystocele repair almost 12 years ago. Her husband is a Insurance underwriter and she sees him infrequently, and therefore has periods of increased frequency of sex. Was recently intimate with her husband and had some lower pelvic pain -- she is not sure if this is due to diverticulitis, something going on with her prior surgeries, UTI, etc. She denies unusual vaginal discharge. She needs a gyn -- prior retired.  Past Medical History:  Diagnosis Date  . Anemia   . Asthma   . Cesarean delivery delivered   . DIVERTICULITIS, HX OF 12/24/2007  . DIVERTICULOSIS-COLON 07/21/2009  . Hematuria, microscopic 10/26/2017   Chronic, since teens; never cystoscopy.  . Irritable bowel syndrome 07/21/2009  . Kidney stones   . Migraine   . Seasonal allergies   . URINARY INCONTINENCE 06/15/2008     Social History   Tobacco Use  . Smoking status: Never Smoker  . Smokeless tobacco: Never Used  . Tobacco comment: smoked socially in college  Vaping Use  . Vaping Use: Never used  Substance Use Topics  . Alcohol use: Yes    Comment: glass of wine x 2 week  . Drug use: No    Past Surgical History:  Procedure Laterality Date  . ABDOMINAL HYSTERECTOMY  06/2009   has her ovaries  . CESAREAN SECTION    . COLONOSCOPY   04/19/2009   diverticulosis   . G2 P2     1 c-section, 1 NSVD  . LEAP  98  . rectocele/cystocele repair  06/2009  . right fibula fx     age 75    Family History  Problem Relation Age of Onset  . Hyperlipidemia Mother   . Hypertension Mother   . Colon polyps Mother   . Diabetes Maternal Grandmother   . Healthy Daughter   . Healthy Daughter   . Colon cancer Neg Hx   . Asthma Neg Hx     No Known Allergies  Current Medications:   Current Outpatient Medications:  .  Hyoscyamine Sulfate (HYOSCYAMINE PO), Take 1 tablet by mouth as needed., Disp: , Rfl:  No current facility-administered medications for this visit.  Facility-Administered Medications Ordered in Other Visits:  .  [COMPLETED] sodium chloride 0.9 % nebulizer solution 3 mL, 3 mL, Nebulization, Once, 3 mL at 12/27/11 1503 **FOLLOWED BY** [COMPLETED] methacholine (PROVOCHOLINE) inhaler solution 0.125 mg, 2 mL, Inhalation, Once, 0.125 mg at 12/27/11 1513 **FOLLOWED BY** [COMPLETED] methacholine (PROVOCHOLINE) inhaler solution 0.5 mg, 2 mL, Inhalation, Once, 0.5 mg at 12/27/11 1512 **FOLLOWED BY** methacholine (PROVOCHOLINE) inhaler solution 2 mg, 2 mL, Inhalation, Once **FOLLOWED BY** methacholine (PROVOCHOLINE) inhaler solution 8 mg, 2 mL, Inhalation, Once **FOLLOWED BY** methacholine (PROVOCHOLINE) inhaler solution 32 mg, 2 mL, Inhalation, Once **FOLLOWED BY** albuterol (PROVENTIL) (5 MG/ML) 0.5% nebulizer solution  2.5 mg, 2.5 mg, Nebulization, Once, Brand Males, MD   Review of Systems:   ROS Negative unless otherwise specified per HPI.  Vitals:   Vitals:   05/12/20 1510  BP: 130/74  Pulse: 84  Temp: (!) 97.3 F (36.3 C)  TempSrc: Temporal  SpO2: 96%  Weight: 137 lb 8 oz (62.4 kg)  Height: 5\' 1"  (1.549 m)     Body mass index is 25.98 kg/m.  Physical Exam:   Physical Exam Vitals and nursing note reviewed.  Constitutional:      General: She is not in acute distress.    Appearance: She is well-developed.  She is not ill-appearing or toxic-appearing.  Cardiovascular:     Rate and Rhythm: Normal rate and regular rhythm.     Pulses: Normal pulses.     Heart sounds: Normal heart sounds, S1 normal and S2 normal.     Comments: No LE edema Pulmonary:     Effort: Pulmonary effort is normal.     Breath sounds: Normal breath sounds.  Abdominal:     General: Abdomen is flat. Bowel sounds are normal.     Palpations: Abdomen is soft.     Tenderness: There is abdominal tenderness in the left lower quadrant. There is guarding. There is no right CVA tenderness, left CVA tenderness or rebound.  Skin:    General: Skin is warm and dry.  Neurological:     Mental Status: She is alert.     GCS: GCS eye subscore is 4. GCS verbal subscore is 5. GCS motor subscore is 6.  Psychiatric:        Speech: Speech normal.        Behavior: Behavior normal. Behavior is cooperative.       Assessment and Plan:   [name] was seen today for abdominal pain.  Diagnoses and all orders for this visit:  Diverticulitis No red flags on exam. Vitals stable. Symptoms consistent with prior episodes. Will start oral augmentin. She was instructed to follow-up with [place] (or urgent care) if no improvement in 2-3 days and if worsening, to go to the ER.  -     Urinalysis, Routine w reflex microscopic -     Urine Culture  Pelvic pain Possibly related to diverticulitis? Recommend avoiding sex during flares. Will check UA and urine culture. Will refer to Gynecology for further eval/mgmt as she is requesting establishing with new gyn. Worsening precautions advised. -     Urinalysis, Routine w reflex microscopic -     Urine Culture -     Ambulatory referral to Gynecology  . Reviewed expectations re: course of current medical issues. . Discussed self-management of symptoms. . Outlined signs and symptoms indicating need for more acute intervention. . Patient verbalized understanding and all questions were answered. . See orders for  this visit as documented in the electronic medical record. . Patient received an After-Visit Summary.  CMA or LPN served as scribe during this visit. History, Physical, and Plan performed by medical provider. The above documentation has been reviewed and is accurate and complete.  Inda Coke, PA-C

## 2020-05-12 NOTE — Patient Instructions (Signed)
It was great to see you!  Start oral antibiotic.  If no improvement in 2-3 days, call us or go to urgent care. If worsening, go to the ER.  I will be in touch with urine results.  Take care,  Inda Coke PA-C

## 2020-05-13 LAB — URINALYSIS, ROUTINE W REFLEX MICROSCOPIC
Bilirubin Urine: NEGATIVE
Ketones, ur: NEGATIVE
Leukocytes,Ua: NEGATIVE
Nitrite: NEGATIVE
Specific Gravity, Urine: 1.015 (ref 1.000–1.030)
Total Protein, Urine: NEGATIVE
Urine Glucose: NEGATIVE
Urobilinogen, UA: 0.2 (ref 0.0–1.0)
pH: 7 (ref 5.0–8.0)

## 2020-05-14 LAB — URINE CULTURE
MICRO NUMBER:: 10598449
SPECIMEN QUALITY:: ADEQUATE

## 2020-05-20 ENCOUNTER — Other Ambulatory Visit: Payer: Self-pay

## 2020-05-21 ENCOUNTER — Ambulatory Visit: Payer: Commercial Managed Care - PPO | Admitting: Obstetrics and Gynecology

## 2020-05-21 ENCOUNTER — Encounter: Payer: Self-pay | Admitting: Obstetrics and Gynecology

## 2020-05-21 VITALS — BP 110/78 | HR 64 | Temp 97.5°F | Resp 14 | Ht 60.0 in | Wt 138.4 lb

## 2020-05-21 DIAGNOSIS — Z113 Encounter for screening for infections with a predominantly sexual mode of transmission: Secondary | ICD-10-CM

## 2020-05-21 DIAGNOSIS — R102 Pelvic and perineal pain: Secondary | ICD-10-CM | POA: Diagnosis not present

## 2020-05-21 DIAGNOSIS — R350 Frequency of micturition: Secondary | ICD-10-CM

## 2020-05-21 DIAGNOSIS — Z90710 Acquired absence of both cervix and uterus: Secondary | ICD-10-CM

## 2020-05-21 DIAGNOSIS — R5383 Other fatigue: Secondary | ICD-10-CM

## 2020-05-21 LAB — POCT URINALYSIS DIPSTICK
Bilirubin, UA: NEGATIVE
Glucose, UA: NEGATIVE
Ketones, UA: NEGATIVE
Nitrite, UA: NEGATIVE
Protein, UA: NEGATIVE
Urobilinogen, UA: 0.2 E.U./dL
pH, UA: 5 (ref 5.0–8.0)

## 2020-05-21 NOTE — Progress Notes (Signed)
49 y.o. [id number] Married Other or two or more races Hispanic or Latino female here for new patient consultation from [place], [place] for pelvic pain.  H/O TVH, SSF, Sling  A&P repair.  She has a h/o diverticulosis, intermittent flares of diverticulitis. She is currently on antibiotics for diverticulitis and a UTI.  She has been having cyclic, "ovulatory" pain for years. She hurts for 2-3 days in a row. The pain is intermittent, crampy, feels bloated. The cramping lasts for a couple of hours at a time, moderate. She isn't sure if it is on one or the other side of her abdomen, she has never tracked the pain. No diarrhea, constipation or urinary c/o.   No incontinence.  Sexually active, no pain with sex, feels sore afterward. Not using lubrication. Not having hot flashes, just feels hot. No night sweats.   In the last week she has felt excessively tired.  She has been married x 2 years, husband is great. Her prior husband cheated and she would like STD testing (vaginal only), just to be safe.  Daughters are 34 and 82.     No LMP recorded. Patient has had a hysterectomy.          Sexually active: Yes.    The current method of family planning is status post hysterectomy.    Exercising: No.  The patient does not participate in regular exercise at present. Smoker:  no  Health Maintenance: Pap:  Unsure, possibly prior to hysterectomy in 2010 History of abnormal Pap:  yes MMG:  12-08-19 density C/BIRADS 1 negative  BMD:   none Colonoscopy: 04-19-09 diverticulosis TDaP:  06-02-15  Gardasil: no    reports that she has never smoked. She has never used smokeless tobacco. She reports current alcohol use. She reports that she does not use drugs.  Past Medical History:  Diagnosis Date  . Anemia   . Asthma   . Cesarean delivery delivered   . DIVERTICULITIS, HX OF 12/24/2007  . DIVERTICULOSIS-COLON 07/21/2009  . Hematuria, microscopic 10/26/2017   Chronic, since teens; never cystoscopy.  .  Irritable bowel syndrome 07/21/2009  . Kidney stones   . Migraine   . Seasonal allergies   . URINARY INCONTINENCE 06/15/2008  No auras.   Past Surgical History:  Procedure Laterality Date  . ABDOMINAL HYSTERECTOMY  06/2009   has her ovaries  . CESAREAN SECTION    . COLONOSCOPY  04/19/2009   diverticulosis   . G2 P2     1 c-section, 1 NSVD  . LEAP  98  . rectocele/cystocele repair  06/2009  . right fibula fx     age 11    Current Outpatient Medications  Medication Sig Dispense Refill  . amoxicillin-clavulanate (AUGMENTIN) 875-125 MG tablet Take 1 tablet by mouth 2 (two) times daily. 20 tablet 0   No current facility-administered medications for this visit.   Facility-Administered Medications Ordered in Other Visits  Medication Dose Route Frequency Provider Last Rate Last Admin  . methacholine (PROVOCHOLINE) inhaler solution 2 mg  2 mL Inhalation Once Brand Males, MD       Followed by  . methacholine (PROVOCHOLINE) inhaler solution 8 mg  2 mL Inhalation Once Brand Males, MD       Followed by  . methacholine (PROVOCHOLINE) inhaler solution 32 mg  2 mL Inhalation Once Brand Males, MD       Followed by  . albuterol (PROVENTIL) (5 MG/ML) 0.5% nebulizer solution 2.5 mg  2.5 mg Nebulization Once Ramaswamy, Murali,  MD        Family History  Problem Relation Age of Onset  . Hyperlipidemia Mother   . Hypertension Mother   . Colon polyps Mother   . Diabetes Maternal Grandmother   . Healthy Daughter   . Healthy Daughter   . Colon cancer Neg Hx   . Asthma Neg Hx     Review of Systems  Constitutional: Negative.   HENT: Negative.   Eyes: Negative.   Respiratory: Negative.   Cardiovascular: Negative.   Gastrointestinal: Negative.   Endocrine: Negative.   Genitourinary: Positive for dysuria, frequency, pelvic pain and urgency.  Musculoskeletal: Negative.   Skin: Negative.   Allergic/Immunologic: Negative.   Neurological: Negative.   Hematological: Negative.    Psychiatric/Behavioral: Negative.     Exam:   BP 110/78 (BP Location: Right Arm, Patient Position: Sitting, Cuff Size: Normal)   Pulse 64   Temp (!) 97.5 F (36.4 C) (Skin)   Resp 14   Ht 5' (1.524 m)   Wt 138 lb 6.4 oz (62.8 kg)   BMI 27.03 kg/m   Weight change: @WEIGHTCHANGE @ Height:   Height: 5' (152.4 cm)  Ht Readings from Last 3 Encounters:  05/21/20 5' (1.524 m)  05/12/20 5\' 1"  (1.549 m)  05/13/19 5\' 1"  (1.549 m)    General appearance: alert, cooperative and appears stated age Head: Normocephalic, without obvious abnormality, atraumatic Neck: no adenopathy, supple, symmetrical, trachea midline and thyroid normal to inspection and palpation Abdomen: soft, non-tender; non distended,  no masses,  no organomegaly Extremities: extremities normal, atraumatic, no cyanosis or edema Skin: Skin color, texture, turgor normal. No rashes or lesions Lymph nodes: Cervical, supraclavicular nodes normal. Neurologic: Grossly normal   Pelvic: External genitalia:  no lesions              Urethra:  normal appearing urethra with no masses, tenderness or lesions              Bartholins and Skenes: normal                 Vagina: normal appearing vagina with normal color and discharge, no lesions              Cervix: absent               Bimanual Exam:  Uterus:  uterus absent              Adnexa: no mass, fullness, tenderness               Rectovaginal: Confirms               Anus:  normal sphincter tone, no lesions  Bladder: tender to palpation  Karmen Bongo chaperoned for the exam.  A:  Cyclic pelvic pain  H/O hysterectomy  Urinary frequency  Screening STD  Fatigue  Bladder tender  P:   Calendar pain and f/u in 3 months for an annual exam  Given prior h/o leep she should still have vaginal pap smears (every 5 years)  Screening STD (declines blood work)  [place], TSH  Send urine for ua, c&s (on antibiotics now, still with bladder c/o)  Discussed the option of OCP's, no  contraindications, she declines  CC; Inda Coke, [place]

## 2020-05-22 LAB — URINALYSIS, MICROSCOPIC ONLY
Bacteria, UA: NONE SEEN
Casts: NONE SEEN /lpf
Epithelial Cells (non renal): NONE SEEN /hpf (ref 0–10)
WBC, UA: NONE SEEN /hpf (ref 0–5)

## 2020-05-22 LAB — FOLLICLE STIMULATING HORMONE: FSH: 3.2 m[IU]/mL

## 2020-05-22 LAB — TSH: TSH: 1.27 u[IU]/mL (ref 0.450–4.500)

## 2020-05-23 LAB — URINE CULTURE

## 2020-05-24 ENCOUNTER — Telehealth: Payer: Self-pay | Admitting: *Deleted

## 2020-05-24 LAB — CHLAMYDIA/GONOCOCCUS/TRICHOMONAS, NAA
Chlamydia by NAA: NEGATIVE
Gonococcus by NAA: NEGATIVE
Trich vag by NAA: NEGATIVE

## 2020-05-24 NOTE — Telephone Encounter (Signed)
-----   Message from Salvadore Dom, MD sent at 05/23/2020  8:41 PM EDT ----- Please let the patient know that her urine did return still c/w infection, but she is on Augmentin which it is sensitive to. Please check if she is feeling better and check if she is done with the antibiotic yet, if not how much does she have left.  If she isn't feeling better, start bactrim DS, 1 po bid x 3 days. Her Ina is in a premenopausal range and her TSH is normal.

## 2020-05-24 NOTE — Telephone Encounter (Signed)
Burnice Logan, RN  05/24/2020 4:10 PM EDT Back to Top    Left message to call Sharee Pimple, RN at [facility].   No alternative number.

## 2020-05-25 NOTE — Telephone Encounter (Addendum)
Beth Dom, MD  05/24/2020 9:44 AM EDT Back to Top    Her cervical cultures are negative, please inform when you call her about her urine results.    Spoke with patient, advised of all results as seen below per Dr. Talbert Nan. Patient reports she is feeling better, urinary symptoms have resolved. She completed Augmentin on 05/22/20. No additional abx sent at this time.   Patient reports hx of rectocele/cystocele repair approximately 12 years ago, "hammock type sling was placed". Patient asking if any concerns with ongoing microscopic hematuria related to bladder sling? Any f/u needed at this time?   Advised per review of 05/21/20 urine micro, neg for RBC. Will forward for Dr. Talbert Nan to review concerns and f/u with any additional recommendations. Patient agreeable.   Routing to Dr. Talbert Nan for review.

## 2020-05-27 NOTE — Telephone Encounter (Signed)
Her micro ua is normal. She shouldn't have hematuria from a sling. She shouldn't need any further w/u at this time.

## 2020-06-01 NOTE — Telephone Encounter (Signed)
Call to patient. Message given to patient as seen below from Dr. Talbert Nan and patient verbalized understanding.   Encounter closed.

## 2020-08-18 ENCOUNTER — Ambulatory Visit: Payer: Commercial Managed Care - PPO | Admitting: Gastroenterology

## 2020-08-18 ENCOUNTER — Encounter: Payer: Self-pay | Admitting: Gastroenterology

## 2020-08-18 VITALS — BP 134/84 | HR 89 | Ht 60.0 in | Wt 142.4 lb

## 2020-08-18 DIAGNOSIS — R1030 Lower abdominal pain, unspecified: Secondary | ICD-10-CM | POA: Diagnosis not present

## 2020-08-18 DIAGNOSIS — R194 Change in bowel habit: Secondary | ICD-10-CM | POA: Diagnosis not present

## 2020-08-18 DIAGNOSIS — R152 Fecal urgency: Secondary | ICD-10-CM | POA: Diagnosis not present

## 2020-08-18 MED ORDER — GLYCOPYRROLATE 1 MG PO TABS
1.0000 mg | ORAL_TABLET | Freq: Two times a day (BID) | ORAL | 11 refills | Status: DC
Start: 2020-08-18 — End: 2020-10-07

## 2020-08-18 MED ORDER — NA SULFATE-K SULFATE-MG SULF 17.5-3.13-1.6 GM/177ML PO SOLN
1.0000 | Freq: Once | ORAL | 0 refills | Status: AC
Start: 2020-08-18 — End: 2020-08-18

## 2020-08-18 NOTE — Progress Notes (Signed)
History of Present Illness: This is a 49 year old female referred by Leamon Arnt, MD for the evaluation of lower abdominal pain, constipation, diarrhea, fecal urgency, change in bowel habits.  She was evaluated previously and treated for irritable bowel syndrome around 2010.  She relates that for the past year she has experienced increased lower abdominal pain that is constantly bothersome and is occasionally exacerbated by bowel movements.  She notes alternating bowel movements with small pellet-like stools and occasionally loose watery liquid stools.  Certain foods clearly bring on looser stools and she avoids them.  She notes moving sensation in her pelvic and rectal area when doing sit ups.  She previously underwent hysterectomy, rectocele repair and cystocele repair.  She was evaluated for pelvic pain at her gynecologist office in June.  She plans to follow-up with her gynecologist in the near future.  Denies weight loss, change in stool caliber, melena, hematochezia, nausea, vomiting, dysphagia, reflux symptoms, chest pain.   Colonoscopy 03/2009 for diarrhea, lower abdominal pain 1) Moderate diverticulosis in the sigmoid to descending 2) Internal hemorrhoids   No Known Allergies Outpatient Medications Prior to Visit  Medication Sig Dispense Refill  . amoxicillin-clavulanate (AUGMENTIN) 875-125 MG tablet Take 1 tablet by mouth 2 (two) times daily. 20 tablet 0   Facility-Administered Medications Prior to Visit  Medication Dose Route Frequency Provider Last Rate Last Admin  . methacholine (PROVOCHOLINE) inhaler solution 2 mg  2 mL Inhalation Once Brand Males, MD       Followed by  . methacholine (PROVOCHOLINE) inhaler solution 8 mg  2 mL Inhalation Once Brand Males, MD       Followed by  . methacholine (PROVOCHOLINE) inhaler solution 32 mg  2 mL Inhalation Once Brand Males, MD       Followed by  . albuterol (PROVENTIL) (5 MG/ML) 0.5% nebulizer solution 2.5 mg  2.5  mg Nebulization Once Brand Males, MD       Past Medical History:  Diagnosis Date  . Anemia   . Asthma   . Cesarean delivery delivered   . DIVERTICULITIS, HX OF 12/24/2007  . DIVERTICULOSIS-COLON 07/21/2009  . Hematuria, microscopic 10/26/2017   Chronic, since teens; never cystoscopy.  . Irritable bowel syndrome 07/21/2009  . Kidney stones   . Migraine   . Seasonal allergies   . URINARY INCONTINENCE 06/15/2008   Past Surgical History:  Procedure Laterality Date  . CESAREAN SECTION    . COLONOSCOPY  04/19/2009   diverticulosis   . G2 P2     1 c-section, 1 NSVD  . LEAP  89  . rectocele/cystocele repair  06/2009  . right fibula fx     age 3  . TOTAL VAGINAL HYSTERECTOMY  06/2009   has her ovaries, and SSF, sling   Social History   Socioeconomic History  . Marital status: Married    Spouse name: Not on file  . Number of children: 2  . Years of education: 66  . Highest education level: Not on file  Occupational History  . Occupation: Sports coach: [employer]  Tobacco Use  . Smoking status: Never Smoker  . Smokeless tobacco: Never Used  . Tobacco comment: smoked socially in college  Vaping Use  . Vaping Use: Never used  Substance and Sexual Activity  . Alcohol use: Yes    Comment: glass of wine x 2 week  . Drug use: No  . Sexual activity: Yes    Birth control/protection: Surgical  Comment: hyst  Other Topics Concern  . Not on file  Social History Narrative   [employer], 2 daughters, 2006 and 2008   Committed relationship;live together   Daily caffeine - 2   Originally from [place]   Occ alcohol,no tobacco or substance aubuse   Social Determinants of Health   Financial Resource Strain:   . Difficulty of Paying Living Expenses: Not on file  Food Insecurity:   . Worried About Charity fundraiser in the Last Year: Not on file  . Ran Out of Food in the Last Year: Not on file  Transportation Needs:   . Lack of Transportation (Medical): Not on  file  . Lack of Transportation (Non-Medical): Not on file  Physical Activity:   . Days of Exercise per Week: Not on file  . Minutes of Exercise per Session: Not on file  Stress:   . Feeling of Stress : Not on file  Social Connections:   . Frequency of Communication with Friends and Family: Not on file  . Frequency of Social Gatherings with Friends and Family: Not on file  . Attends Religious Services: Not on file  . Active Member of Clubs or Organizations: Not on file  . Attends Archivist Meetings: Not on file  . Marital Status: Not on file   Family History  Problem Relation Age of Onset  . Hyperlipidemia Mother   . Hypertension Mother   . Colon polyps Mother   . Diabetes Maternal Grandmother   . Healthy Daughter   . Healthy Daughter   . Colon cancer Neg Hx   . Asthma Neg Hx   . Esophageal cancer Neg Hx   . Stomach cancer Neg Hx   . Pancreatic cancer Neg Hx   . Liver disease Neg Hx      Review of Systems: Pertinent positive and negative review of systems were noted in the above HPI section. All other review of systems were otherwise negative.   Physical Exam: General: Well developed, well nourished, no acute distress Head: Normocephalic and atraumatic Eyes:  sclerae anicteric, EOMI Ears: Normal auditory acuity Mouth: Not examined, mask on during Covid-19 pandemic Neck: Supple, no masses or thyromegaly Lungs: Clear throughout to auscultation Heart: Regular rate and rhythm; no murmurs, rubs or bruits Abdomen: Soft, mild lower abdominal tenderness and non distended. No masses, hepatosplenomegaly or hernias noted. Normal Bowel sounds Rectal: Deferred to colonoscopy Musculoskeletal: Symmetrical with no gross deformities  Skin: No lesions on visible extremities Pulses:  Normal pulses noted Extremities: No clubbing, cyanosis, edema or deformities noted Neurological: Alert oriented x 4, grossly nonfocal Cervical Nodes:  No significant cervical adenopathy Inguinal  Nodes: No significant inguinal adenopathy Psychological:  Alert and cooperative. Normal mood and affect   Assessment and Recommendations:  1.  Change in bowel habits, alternating diarrhea and constipation, lower abdominal pain.  Moderate left colon diverticulosis is noted in 2010.  She is advised to follow-up with her gynecologist for further evaluation of pelvic etiologies.  Begin Benefiber 1 T qd and begin glycopyrrolate 1 mg po bid. increase daily water intake.  Avoid foods that trigger GI symptoms.  Schedule colonoscopy. The risks (including bleeding, perforation, infection, missed lesions, medication reactions and possible hospitalization or surgery if complications occur), benefits, and alternatives to colonoscopy with possible biopsy and possible polypectomy were discussed with the patient and they consent to proceed.     cc: Leamon Arnt, [address],  [place] [id number]

## 2020-08-18 NOTE — Patient Instructions (Signed)
Start over the [facility] daily.   Increase your water intake daily.   We have sent the following medications to your pharmacy for you to pick up at your convenience: glycopyrrolate.   You have been scheduled for a colonoscopy. Please follow written instructions given to you at your visit today.  Please pick up your prep supplies at the pharmacy within the next 1-3 days. If you use inhalers (even only as needed), please bring them with you on the day of your procedure.   Thank you for choosing me and [facility].  Pricilla Riffle. Dagoberto Ligas., MD., [facility] Reg[facility]

## 2020-09-28 ENCOUNTER — Telehealth: Payer: Self-pay | Admitting: Gastroenterology

## 2020-09-28 NOTE — Telephone Encounter (Signed)
Pt called stating that she forgot that she could not eat solid foods today so she ate a small bowl of ground [place] with cabbage. He procedure is scheduled for tomorrow at 2:00pm. She wants to know if she can still have procedure. Pls call her.

## 2020-09-28 NOTE — Telephone Encounter (Signed)
Returned pts call.  Instructed her to not eat any more solid food and to be sure to drink plenty of fluids.  Pt. verbalized understanding.

## 2020-09-29 ENCOUNTER — Encounter: Payer: Self-pay | Admitting: Gastroenterology

## 2020-09-29 ENCOUNTER — Ambulatory Visit (AMBULATORY_SURGERY_CENTER): Payer: Commercial Managed Care - PPO | Admitting: Gastroenterology

## 2020-09-29 ENCOUNTER — Other Ambulatory Visit: Payer: Self-pay

## 2020-09-29 VITALS — BP 132/79 | HR 68 | Temp 98.2°F | Resp 13 | Ht 60.0 in | Wt 142.0 lb

## 2020-09-29 DIAGNOSIS — K621 Rectal polyp: Secondary | ICD-10-CM | POA: Diagnosis not present

## 2020-09-29 DIAGNOSIS — K5730 Diverticulosis of large intestine without perforation or abscess without bleeding: Secondary | ICD-10-CM | POA: Diagnosis not present

## 2020-09-29 DIAGNOSIS — D128 Benign neoplasm of rectum: Secondary | ICD-10-CM

## 2020-09-29 DIAGNOSIS — K640 First degree hemorrhoids: Secondary | ICD-10-CM

## 2020-09-29 DIAGNOSIS — R194 Change in bowel habit: Secondary | ICD-10-CM

## 2020-09-29 DIAGNOSIS — R1030 Lower abdominal pain, unspecified: Secondary | ICD-10-CM

## 2020-09-29 MED ORDER — SODIUM CHLORIDE 0.9 % IV SOLN: 500.0000 mL | INTRAVENOUS | Status: DC

## 2020-09-29 NOTE — Op Note (Signed)
[facility] Patient Name: [name] Procedure Date: 09/29/2020 2:03 PM MRN: [id number] Endoscopist: Ladene Artist , MD Age: 49 Referring MD:  Date of Birth: [birth year] Gender: Female Account #: [id number] Procedure:                Colonoscopy Indications:              Lower abdominal pain, Change in bowel habits Medicines:                Monitored Anesthesia Care Procedure:                Pre-Anesthesia Assessment:                           - Prior to the procedure, a History and Physical                            was performed, and patient medications and                            allergies were reviewed. The patient's tolerance of                            previous anesthesia was also reviewed. The risks                            and benefits of the procedure and the sedation                            options and risks were discussed with the patient.                            All questions were answered, and informed consent                            was obtained. Prior Anticoagulants: The patient has                            taken no previous anticoagulant or antiplatelet                            agents. ASA Grade Assessment: II - A patient with                            mild systemic disease. After reviewing the risks                            and benefits, the patient was deemed in                            satisfactory condition to undergo the procedure.                           After obtaining informed consent, the colonoscope  was passed under direct vision. Throughout the                            procedure, the patient's blood pressure, pulse, and                            oxygen saturations were monitored continuously. The                            Colonoscope was introduced through the anus and                            advanced to the the terminal ileum, with                            identification of the  appendiceal orifice and IC                            valve. The ileocecal valve, appendiceal orifice,                            and rectum were photographed. The quality of the                            bowel preparation was good. The colonoscopy was                            performed without difficulty. The patient tolerated                            the procedure well. Scope In: 2:18:30 PM Scope Out: 2:37:30 PM Scope Withdrawal Time: 0 hours 15 minutes 39 seconds  Total Procedure Duration: 0 hours 19 minutes 0 seconds  Findings:                 The perianal and digital rectal examinations were                            normal.                           The terminal ileum appeared normal.                           Multiple large-mouthed diverticula were found in                            the right colon. There was no evidence of                            diverticular bleeding.                           Multiple large-mouthed diverticula were found in  the left colon. There was no evidence of                            diverticular bleeding.                           A 5 mm polyp was found in the rectum. The polyp was                            sessile. The polyp was removed with a cold snare.                            Resection and retrieval were complete.                           Internal hemorrhoids were found during                            retroflexion. The hemorrhoids were small and Grade                            I (internal hemorrhoids that do not prolapse).                           The exam was otherwise without abnormality on                            direct and retroflexion views. Complications:            No immediate complications. Estimated blood loss:                            None. Estimated Blood Loss:     Estimated blood loss: none. Impression:               - Mild diverticulosis in the right colon. There was                             no evidence of diverticular bleeding.                           - Moderate diverticulosis in the left colon. There                            was no evidence of diverticular bleeding.                           - Normal appearing terminal ileum.                           - One 5 mm polyp in the rectum, removed with a cold                            snare. Resected and retrieved.                           -  Internal hemorrhoids.                           - The examination was otherwise normal on direct                            and retroflexion views. Recommendation:           - Repeat colonoscopy date to be determined after                            pending pathology results are reviewed for                            surveillance based on pathology results.                           - Patient has a contact number available for                            emergencies. The signs and symptoms of potential                            delayed complications were discussed with the                            patient. Return to normal activities tomorrow.                            Written discharge instructions were provided to the                            patient.                           - High fiber diet, daily fiber supplement and                            adequate daily water intake long term.                           - Continue present medications.                           - Await pathology results. Ladene Artist, MD 09/29/2020 2:43:27 PM This report has been signed electronically.

## 2020-09-29 NOTE — Patient Instructions (Signed)
Information on polyps, diverticulosis and hemorrhoids given to you today.  Await pathology results.  Resume previous diet and medications.  High fiber diet plus fiber supplement and plenty of water.  YOU HAD AN ENDOSCOPIC PROCEDURE TODAY AT [facility]:   Refer to the procedure report that was given to you for any specific questions about what was found during the examination.  If the procedure report does not answer your questions, please call your gastroenterologist to clarify.  If you requested that your care partner not be given the details of your procedure findings, then the procedure report has been included in a sealed envelope for you to review at your convenience later.  YOU SHOULD EXPECT: Some feelings of bloating in the abdomen. Passage of more gas than usual.  Walking can help get rid of the air that was put into your GI tract during the procedure and reduce the bloating. If you had a lower endoscopy (such as a colonoscopy or flexible sigmoidoscopy) you may notice spotting of blood in your stool or on the toilet paper. If you underwent a bowel prep for your procedure, you may not have a normal bowel movement for a few days.  Please Note:  You might notice some irritation and congestion in your nose or some drainage.  This is from the oxygen used during your procedure.  There is no need for concern and it should clear up in a day or so.  SYMPTOMS TO REPORT IMMEDIATELY:   Following lower endoscopy (colonoscopy or flexible sigmoidoscopy):  Excessive amounts of blood in the stool  Significant tenderness or worsening of abdominal pains  Swelling of the abdomen that is new, acute  Fever of 100F or higher    For urgent or emergent issues, a gastroenterologist can be reached at any hour by calling [phone]. Do not use MyChart messaging for urgent concerns.    DIET:  We do recommend a small meal at first, but then you may proceed to your regular diet.  Drink  plenty of fluids but you should avoid alcoholic beverages for 24 hours.  ACTIVITY:  You should plan to take it easy for the rest of today and you should NOT DRIVE or use heavy machinery until tomorrow (because of the sedation medicines used during the test).    FOLLOW UP: Our staff will call the number listed on your records 48-72 hours following your procedure to check on you and address any questions or concerns that you may have regarding the information given to you following your procedure. If we do not reach you, we will leave a message.  We will attempt to reach you two times.  During this call, we will ask if you have developed any symptoms of COVID 19. If you develop any symptoms (ie: fever, flu-like symptoms, shortness of breath, cough etc.) before then, please call [phone].  If you test positive for Covid 19 in the 2 weeks post procedure, please call and report this information to [place].    If any biopsies were taken you will be contacted by phone or by letter within the next 1-3 weeks.  Please call us at [phone] if you have not heard about the biopsies in 3 weeks.    SIGNATURES/CONFIDENTIALITY: You and/or your care partner have signed paperwork which will be entered into your electronic medical record.  These signatures attest to the fact that that the information above on your After Visit Summary has been reviewed and is understood.  Full responsibility of the confidentiality of this discharge information lies with you and/or your care-partner.

## 2020-09-29 NOTE — Progress Notes (Signed)
Called to room to assist during endoscopic procedure.  Patient ID and intended procedure confirmed with present staff. Received instructions for my participation in the procedure from the performing physician.  

## 2020-09-29 NOTE — Progress Notes (Signed)
pt tolerated well. VSS. awake and to recovery. Report given to RN.  

## 2020-10-01 ENCOUNTER — Telehealth: Payer: Self-pay | Admitting: *Deleted

## 2020-10-01 NOTE — Telephone Encounter (Signed)
Left message on f/u call 

## 2020-10-06 NOTE — Progress Notes (Signed)
49 y.o. [id number] Married Other or two or more races Hispanic or Latino female here for annual exam. H/O TAH, A&P repair, h/o LEEP. She was having pelvic pain, she saw GI for abdominal pain and it has resolved with using meta-mucil She also had a colonoscopy that was normal. She saw her doctor that did her  pelvic floor  surgery for the  Strange feeling she had while doing sit ups and she said that her pelvic floor looks good.   No urinary incontinence. She does void frequently. Not a problem.   Sexually active, some discomfort with intercourse if she has frequent intercourse. She hasn't tried a lubricant yet.     No LMP recorded. Patient has had a hysterectomy.          Sexually active: Yes.    The current method of family planning is status post hysterectomy.    Exercising: Yes.    Cardio, strength training  Smoker:  no  Health Maintenance: Pap:  Unsure, possibly prior to hysterectomy in 2010  History of abnormal Pap:  Yes h/o LEEP MMG:  12-08-19 density C/BIRADS 1 negative  BMD:   None  Colonoscopy: 2021 normal, f/u q 5 years (h/o diverticulitis) TDaP:  06/02/15  Gardasil: no   reports that she has never smoked. She has never used smokeless tobacco. She reports current alcohol use. She reports that she does not use drugs. Daughters are 66 and 28. Splits custody with her ex. Works in [employer].   Past Medical History:  Diagnosis Date  . Anemia   . Asthma   . Cesarean delivery delivered   . DIVERTICULITIS, HX OF 12/24/2007  . DIVERTICULOSIS-COLON 07/21/2009  . Hematuria, microscopic 10/26/2017   Chronic, since teens; never cystoscopy.  . Irritable bowel syndrome 07/21/2009  . Kidney stones   . Migraine   . Seasonal allergies   . URINARY INCONTINENCE 06/15/2008    Past Surgical History:  Procedure Laterality Date  . CESAREAN SECTION    . COLONOSCOPY  04/19/2009   diverticulosis   . G2 P2     1 c-section, 1 NSVD  . LEAP  89  . rectocele/cystocele repair  06/2009  . right  fibula fx     age 69  . TOTAL VAGINAL HYSTERECTOMY  06/2009   has her ovaries, and SSF, sling    Current Outpatient Medications  Medication Sig Dispense Refill  . glycopyrrolate (ROBINUL) 1 MG tablet Take 1 tablet (1 mg total) by mouth 2 (two) times daily. (Patient not taking: Reported on 09/29/2020) 60 tablet 11   No current facility-administered medications for this visit.   Facility-Administered Medications Ordered in Other Visits  Medication Dose Route Frequency Provider Last Rate Last Admin  . methacholine (PROVOCHOLINE) inhaler solution 2 mg  2 mL Inhalation Once Brand Males, MD       Followed by  . methacholine (PROVOCHOLINE) inhaler solution 8 mg  2 mL Inhalation Once Brand Males, MD       Followed by  . methacholine (PROVOCHOLINE) inhaler solution 32 mg  2 mL Inhalation Once Brand Males, MD       Followed by  . albuterol (PROVENTIL) (5 MG/ML) 0.5% nebulizer solution 2.5 mg  2.5 mg Nebulization Once Brand Males, MD        Family History  Problem Relation Age of Onset  . Hyperlipidemia Mother   . Hypertension Mother   . Colon polyps Mother   . Diabetes Maternal Grandmother   . Healthy Daughter   .  Healthy Daughter   . Colon cancer Neg Hx   . Asthma Neg Hx   . Esophageal cancer Neg Hx   . Stomach cancer Neg Hx   . Pancreatic cancer Neg Hx   . Liver disease Neg Hx     Review of Systems  All other systems reviewed and are negative.   Exam:   There were no vitals taken for this visit.  Weight change: @WEIGHTCHANGE @ Height:      Ht Readings from Last 3 Encounters:  09/29/20 5' (1.524 m)  08/18/20 5' (1.524 m)  05/21/20 5' (1.524 m)    General appearance: alert, cooperative and appears stated age Head: Normocephalic, without obvious abnormality, atraumatic Neck: no adenopathy, supple, symmetrical, trachea midline and thyroid normal to inspection and palpation Lungs: clear to auscultation bilaterally Cardiovascular: regular rate and  rhythm Breasts: normal appearance, no masses or tenderness Abdomen: soft, non-tender; non distended,  no masses,  no organomegaly Extremities: extremities normal, atraumatic, no cyanosis or edema Skin: Skin color, texture, turgor normal. No rashes or lesions Lymph nodes: Cervical, supraclavicular, and axillary nodes normal. No abnormal inguinal nodes palpated Neurologic: Grossly normal   Pelvic: External genitalia:  no lesions              Urethra:  normal appearing urethra with no masses, tenderness or lesions              Bartholins and Skenes: normal                 Vagina: normal appearing vagina with normal color and discharge, no lesions              Cervix: absent               Bimanual Exam:  Uterus:  uterus absent              Adnexa: no mass, fullness, tenderness               Rectovaginal: Confirms               Anus:  normal sphincter tone, no lesions  Gae Dry chaperoned for the exam.  A:  Well Woman with normal exam  Prior abdominal pelvic pain resolved with metamucil  P:   Pap with hpv from vaginal apex  Mammogram in 1/22  Colonoscopy UTD  Discussed breast self exam  Discussed calcium and vit D intake

## 2020-10-07 ENCOUNTER — Other Ambulatory Visit (HOSPITAL_COMMUNITY)
Admission: RE | Admit: 2020-10-07 | Discharge: 2020-10-07 | Disposition: A | Discharge status: Home / Self Care | Payer: Commercial Managed Care - PPO | Source: Ambulatory Visit | Attending: Obstetrics and Gynecology | Admitting: Obstetrics and Gynecology

## 2020-10-07 ENCOUNTER — Other Ambulatory Visit: Payer: Self-pay

## 2020-10-07 ENCOUNTER — Ambulatory Visit: Payer: Commercial Managed Care - PPO | Admitting: Obstetrics and Gynecology

## 2020-10-07 ENCOUNTER — Encounter: Payer: Self-pay | Admitting: Obstetrics and Gynecology

## 2020-10-07 VITALS — BP 122/74 | Ht 60.0 in | Wt 140.2 lb

## 2020-10-07 DIAGNOSIS — Z87410 Personal history of cervical dysplasia: Secondary | ICD-10-CM

## 2020-10-07 DIAGNOSIS — Z1272 Encounter for screening for malignant neoplasm of vagina: Secondary | ICD-10-CM

## 2020-10-07 DIAGNOSIS — Z01419 Encounter for gynecological examination (general) (routine) without abnormal findings: Secondary | ICD-10-CM | POA: Diagnosis not present

## 2020-10-07 NOTE — Patient Instructions (Signed)
EXERCISE AND DIET:  We recommended that you start or continue a regular exercise program for good health. Regular exercise means any activity that makes your heart beat faster and makes you sweat.  We recommend exercising at least 30 minutes per day at least 3 days a week, preferably 4 or 5.  We also recommend a diet low in fat and sugar.  Inactivity, poor dietary choices and obesity can cause diabetes, heart attack, stroke, and kidney damage, among others.    ALCOHOL AND SMOKING:  Women should limit their alcohol intake to no more than 7 drinks/beers/glasses of wine (combined, not each!) per week. Moderation of alcohol intake to this level decreases your risk of breast cancer and liver damage. And of course, no recreational drugs are part of a healthy lifestyle.  And absolutely no smoking or even second hand smoke. Most people know smoking can cause heart and lung diseases, but did you know it also contributes to weakening of your bones? Aging of your skin?  Yellowing of your teeth and nails?  CALCIUM AND VITAMIN D:  Adequate intake of calcium and Vitamin D are recommended.  The recommendations for exact amounts of these supplements seem to change often, but generally speaking 1,000 mg of calcium (between diet and supplement) and 800 units of Vitamin D per day seems prudent. Certain women may benefit from higher intake of Vitamin D.  If you are among these women, your doctor will have told you during your visit.    PAP SMEARS:  Pap smears, to check for cervical cancer or precancers,  have traditionally been done yearly, although recent scientific advances have shown that most women can have pap smears less often.  However, every woman still should have a physical exam from her gynecologist every year. It will include a breast check, inspection of the vulva and vagina to check for abnormal growths or skin changes, a visual exam of the cervix, and then an exam to evaluate the size and shape of the uterus and  ovaries.  And after 49 years of age, a rectal exam is indicated to check for rectal cancers. We will also provide age appropriate advice regarding health maintenance, like when you should have certain vaccines, screening for sexually transmitted diseases, bone density testing, colonoscopy, mammograms, etc.   MAMMOGRAMS:  All women over 40 years old should have a yearly mammogram. Many facilities now offer a "3D" mammogram, which may cost around $50 extra out of pocket. If possible,  we recommend you accept the option to have the 3D mammogram performed.  It both reduces the number of women who will be called back for extra views which then turn out to be normal, and it is better than the routine mammogram at detecting truly abnormal areas.    COLON CANCER SCREENING: Now recommend starting at age 45. At this time colonoscopy is not covered for routine screening until 50. There are take home tests that can be done between 45-49.   COLONOSCOPY:  Colonoscopy to screen for colon cancer is recommended for all women at age 50.  We know, you hate the idea of the prep.  We agree, BUT, having colon cancer and not knowing it is worse!!  Colon cancer so often starts as a polyp that can be seen and removed at colonscopy, which can quite literally save your life!  And if your first colonoscopy is normal and you have no family history of colon cancer, most women don't have to have it again for   10 years.  Once every ten years, you can do something that may end up saving your life, right?  We will be happy to help you get it scheduled when you are ready.  Be sure to check your insurance coverage so you understand how much it will cost.  It may be covered as a preventative service at no cost, but you should check your particular policy.      Breast Self-Awareness Breast self-awareness means being familiar with how your breasts look and feel. It involves checking your breasts regularly and reporting any changes to your  health care provider. Practicing breast self-awareness is important. A change in your breasts can be a sign of a serious medical problem. Being familiar with how your breasts look and feel allows you to find any problems early, when treatment is more likely to be successful. All women should practice breast self-awareness, including women who have had breast implants. How to do a breast self-exam One way to learn what is normal for your breasts and whether your breasts are changing is to do a breast self-exam. To do a breast self-exam: Look for Changes  1. Remove all the clothing above your waist. 2. Stand in front of a mirror in a room with good lighting. 3. Put your hands on your hips. 4. Push your hands firmly downward. 5. Compare your breasts in the mirror. Look for differences between them (asymmetry), such as: ? Differences in shape. ? Differences in size. ? Puckers, dips, and bumps in one breast and not the other. 6. Look at each breast for changes in your skin, such as: ? Redness. ? Scaly areas. 7. Look for changes in your nipples, such as: ? Discharge. ? Bleeding. ? Dimpling. ? Redness. ? A change in position. Feel for Changes Carefully feel your breasts for lumps and changes. It is best to do this while lying on your back on the floor and again while sitting or standing in the shower or tub with soapy water on your skin. Feel each breast in the following way:  Place the arm on the side of the breast you are examining above your head.  Feel your breast with the other hand.  Start in the nipple area and make  inch (2 cm) overlapping circles to feel your breast. Use the pads of your three middle fingers to do this. Apply light pressure, then medium pressure, then firm pressure. The light pressure will allow you to feel the tissue closest to the skin. The medium pressure will allow you to feel the tissue that is a little deeper. The firm pressure will allow you to feel the tissue  close to the ribs.  Continue the overlapping circles, moving downward over the breast until you feel your ribs below your breast.  Move one finger-width toward the center of the body. Continue to use the  inch (2 cm) overlapping circles to feel your breast as you move slowly up toward your collarbone.  Continue the up and down exam using all three pressures until you reach your armpit.  Write Down What You Find  Write down what is normal for each breast and any changes that you find. Keep a written record with breast changes or normal findings for each breast. By writing this information down, you do not need to depend only on memory for size, tenderness, or location. Write down where you are in your menstrual cycle, if you are still menstruating. If you are having trouble noticing differences   in your breasts, do not get discouraged. With time you will become more familiar with the variations in your breasts and more comfortable with the exam. How often should I examine my breasts? Examine your breasts every month. If you are breastfeeding, the best time to examine your breasts is after a feeding or after using a breast pump. If you menstruate, the best time to examine your breasts is 5-7 days after your period is over. During your period, your breasts are lumpier, and it may be more difficult to notice changes. When should I see my health care provider? See your health care provider if you notice:  A change in shape or size of your breasts or nipples.  A change in the skin of your breast or nipples, such as a reddened or scaly area.  Unusual discharge from your nipples.  A lump or thick area that was not there before.  Pain in your breasts.  Anything that concerns you.  

## 2020-10-08 LAB — CYTOLOGY - PAP
Comment: NEGATIVE
Diagnosis: NEGATIVE
High risk HPV: NEGATIVE

## 2020-10-12 ENCOUNTER — Encounter: Payer: Self-pay | Admitting: Gastroenterology

## 2020-12-28 LAB — HM MAMMOGRAPHY

## 2021-01-21 ENCOUNTER — Encounter: Payer: Self-pay | Admitting: Family Medicine

## 2021-03-15 ENCOUNTER — Telehealth: Payer: Self-pay

## 2021-03-15 ENCOUNTER — Telehealth (INDEPENDENT_AMBULATORY_CARE_PROVIDER_SITE_OTHER): Payer: Commercial Managed Care - PPO | Admitting: Physician Assistant

## 2021-03-15 ENCOUNTER — Encounter: Payer: Self-pay | Admitting: Physician Assistant

## 2021-03-15 ENCOUNTER — Other Ambulatory Visit: Payer: Self-pay

## 2021-03-15 ENCOUNTER — Telehealth: Payer: Self-pay | Admitting: Gastroenterology

## 2021-03-15 DIAGNOSIS — K5792 Diverticulitis of intestine, part unspecified, without perforation or abscess without bleeding: Secondary | ICD-10-CM | POA: Diagnosis not present

## 2021-03-15 MED ORDER — AMOXICILLIN-POT CLAVULANATE 875-125 MG PO TABS: 1.0000 | ORAL_TABLET | Freq: Two times a day (BID) | ORAL | 0 refills | Status: DC

## 2021-03-15 NOTE — Telephone Encounter (Signed)
Looks like patient saw [name] last June for Diverticulitis. I do not see anything other than that in her chart. Please advise.

## 2021-03-15 NOTE — Telephone Encounter (Signed)
Pt is requesting an antibiotic for her diverticulitis. She states she usually calls and something is sent in for her infection.

## 2021-03-15 NOTE — Progress Notes (Signed)
TELEPHONE ENCOUNTER   Patient verbally agreed to telephone visit and is aware that copayment and coinsurance may apply. Patient was treated using telemedicine according to accepted telemedicine protocols.  Location of the patient: home Location of provider: [facility] Names of all persons participating in the telemedicine service and role in the encounter: Inda Coke, [place] , [employer]boy  Subjective:   Chief Complaint  Patient presents with  . Diverticulitis    Has had this flare up before, bloating, pain in the abdomen area, feels like she has to use the bathroom, but cannot go, very uncomfortable at night. Happens often, hard to walk at times.     HPI   Diverticulitis Has known hx of diverticulosis and diverticulitis. Last colonoscopy 09/29/20 does show ongoing diverticula: - Multiple large-mouthed diverticula were found in the right colon. There was no evidence of diverticular bleeding. - Multiple large-mouthed diverticula were found in the left colon. There was no evidence of diverticular bleeding.  Symptoms started about 4 days ago. She is trying to limit her diet to help with abdominal pain. She is about to travel to [place]. Pain in lower left side, but also in lower back. Feels bloated. Not having diarrhea, but having regular stools. Denies any rectal bleeding, fever, chills, malaise, urinary issues. Pain is in LLQ, denies RLQ pain.  Per chart, has had about 6-7 flares in her lifetime.  Does admit she was constipated prior to this flare.  Patient Active Problem List   Diagnosis Date Noted  . History of diverticulitis 05/13/2019  . Hematuria, microscopic 10/26/2017  . Mild intermittent asthma 01/29/2012  . Allergic rhinitis 09/15/2011  . Irritable bowel syndrome 07/21/2009   Social History   Tobacco Use  . Smoking status: Never Smoker  . Smokeless tobacco: Never Used  . Tobacco comment: smoked socially in college  Substance Use Topics  . Alcohol use: Yes     Comment: glass of wine x 2 week    Current Outpatient Medications:  .  amoxicillin-clavulanate (AUGMENTIN) 875-125 MG tablet, Take 1 tablet by mouth 2 (two) times daily., Disp: 20 tablet, Rfl: 0 No current facility-administered medications for this visit.  Facility-Administered Medications Ordered in Other Visits:  .  [COMPLETED] sodium chloride 0.9 % nebulizer solution 3 mL, 3 mL, Nebulization, Once, 3 mL at 12/27/11 1503 **FOLLOWED BY** [COMPLETED] methacholine (PROVOCHOLINE) inhaler solution 0.125 mg, 2 mL, Inhalation, Once, 0.125 mg at 12/27/11 1513 **FOLLOWED BY** [COMPLETED] methacholine (PROVOCHOLINE) inhaler solution 0.5 mg, 2 mL, Inhalation, Once, 0.5 mg at 12/27/11 1512 **FOLLOWED BY** methacholine (PROVOCHOLINE) inhaler solution 2 mg, 2 mL, Inhalation, Once **FOLLOWED BY** methacholine (PROVOCHOLINE) inhaler solution 8 mg, 2 mL, Inhalation, Once **FOLLOWED BY** methacholine (PROVOCHOLINE) inhaler solution 32 mg, 2 mL, Inhalation, Once **FOLLOWED BY** albuterol (PROVENTIL) (5 MG/ML) 0.5% nebulizer solution 2.5 mg, 2.5 mg, Nebulization, Once, Brand Males, MD No Known Allergies  Assessment & Plan:   1. Diverticulitis   No red flags on discussion. Start oral augmentin. If any worsening, needs in person evaluation and/or CT. Discussed after resolution of flare needs to work on better bowel regimen (has had constipation prior to this flare.)  No orders of the defined types were placed in this encounter.  Meds ordered this encounter  Medications  . amoxicillin-clavulanate (AUGMENTIN) 875-125 MG tablet    Sig: Take 1 tablet by mouth 2 (two) times daily.    Dispense:  20 tablet    Refill:  0    Order Specific Question:   Supervising Provider  Answer:   Vivi Barrack [place], [place] 03/15/2021  Time spent with the patient: 7 minutes, spent in obtaining information about her symptoms, reviewing her previous labs, evaluations, and treatments, counseling her  about her condition (please see the discussed topics above), and developing a plan to further investigate it; she had a number of questions which I addressed.

## 2021-03-15 NOTE — Telephone Encounter (Signed)
Pt states that she is having a diverticulosis flare up and would like some antibiotics. Her pharmacy is [facility] on [address]. Pls call her.

## 2021-03-15 NOTE — Telephone Encounter (Signed)
Patient reports some abdominal pain and is requesting antibiotics for diverticulitis.  Patient has not been tx here for diverticulitis.  She does have a hx of diverticulosis.  She was evaluated in Nov 2021 for lower abdominal pain thought to be IBS.  She reports this is different.  She is advised that need to have an office visit as not been tx here for diverticulitis.  She is going out of town and does not want to come in for an office visit.  She wants to call her PCP and see if they will call in the antibiotic.  She will call back for additional questions or concerns.

## 2021-03-15 NOTE — Telephone Encounter (Signed)
Patient has been scheduled for video visit to discuss.

## 2021-07-28 ENCOUNTER — Other Ambulatory Visit: Payer: Self-pay

## 2021-07-28 ENCOUNTER — Ambulatory Visit (INDEPENDENT_AMBULATORY_CARE_PROVIDER_SITE_OTHER): Payer: Commercial Managed Care - PPO | Admitting: Physician Assistant

## 2021-07-28 ENCOUNTER — Encounter: Payer: Self-pay | Admitting: Physician Assistant

## 2021-07-28 VITALS — BP 129/89 | HR 86 | Temp 97.9°F | Ht 60.0 in | Wt 141.0 lb

## 2021-07-28 DIAGNOSIS — K644 Residual hemorrhoidal skin tags: Secondary | ICD-10-CM

## 2021-07-28 MED ORDER — HYDROCORTISONE (PERIANAL) 2.5 % EX CREA: 1.0000 "application " | TOPICAL_CREAM | Freq: Two times a day (BID) | CUTANEOUS | 0 refills | Status: DC

## 2021-07-28 NOTE — Progress Notes (Signed)
Acute Office Visit  Subjective:    Patient ID: [name], female    DOB: [birth year], 50 y.o.   MRN: [id number]  Chief Complaint  Patient presents with   Hemorrhoids    HPI Patient is in today for external hemorrhoids. She feels like she   Has not been taking any stool softeners. Tried Preparation-H last night and this did help some.  Dr. Fuller Plan is her GI for IBS.   Past Medical History:  Diagnosis Date   Anemia    Asthma    Cesarean delivery delivered    DIVERTICULITIS, HX OF 12/24/2007   DIVERTICULOSIS-COLON 07/21/2009   Hematuria, microscopic 10/26/2017   Chronic, since teens; never cystoscopy.   Irritable bowel syndrome 07/21/2009   Kidney stones    Migraine    Seasonal allergies    URINARY INCONTINENCE 06/15/2008    Past Surgical History:  Procedure Laterality Date   CESAREAN SECTION     COLONOSCOPY  04/19/2009   diverticulosis    G2 P2     1 c-section, 1 NSVD   LEAP  98   rectocele/cystocele repair  06/2009   right fibula fx     age 74   TOTAL VAGINAL HYSTERECTOMY  06/2009   has her ovaries, and SSF, sling    Family History  Problem Relation Age of Onset   Hyperlipidemia Mother    Hypertension Mother    Colon polyps Mother    Diabetes Maternal Geophysicist/field seismologist    Healthy Daughter    Healthy Daughter    Colon cancer Neg Hx    Asthma Neg Hx    Esophageal cancer Neg Hx    Stomach cancer Neg Hx    Pancreatic cancer Neg Hx    Liver disease Neg Hx     Social History   Socioeconomic History   Marital status: Married    Spouse name: Not on file   Number of children: 2   Years of education: 16   Highest education level: Not on file  Occupational History   Occupation: Sports coach: [employer]  Tobacco Use   Smoking status: Never   Smokeless tobacco: Never   Tobacco comments:    smoked socially in college  Vaping Use   Vaping Use: Never used  Substance and Sexual Activity   Alcohol use: Yes    Comment: glass of wine x 2 week    Drug use: No   Sexual activity: Yes    Birth control/protection: Surgical    Comment: hyst  Other Topics Concern   Not on file  Social History Narrative   [employer], 2 daughters, 2006 and 2008   Committed relationship;live together   Daily caffeine - 2   Originally from [place]   Occ alcohol,no tobacco or substance aubuse   Social Determinants of Health   Financial Resource Strain: Not on file  Food Insecurity: Not on file  Transportation Needs: Not on file  Physical Activity: Not on file  Stress: Not on file  Social Connections: Not on file  Intimate Partner Violence: Not on file    Outpatient Medications Prior to Visit  Medication Sig Dispense Refill   amoxicillin-clavulanate (AUGMENTIN) 875-125 MG tablet Take 1 tablet by mouth 2 (two) times daily. 20 tablet 0   Facility-Administered Medications Prior to Visit  Medication Dose Route Frequency Provider Last Rate Last Admin   methacholine (PROVOCHOLINE) inhaler solution 2 mg  2 mL Inhalation Once Brand Males, MD  Followed by   methacholine (PROVOCHOLINE) inhaler solution 8 mg  2 mL Inhalation Once Brand Males, MD       Followed by   methacholine (PROVOCHOLINE) inhaler solution 32 mg  2 mL Inhalation Once Brand Males, MD       Followed by   albuterol (PROVENTIL) (5 MG/ML) 0.5% nebulizer solution 2.5 mg  2.5 mg Nebulization Once Brand Males, MD        No Known Allergies  Review of Systems REFER TO HPI FOR PERTINENT POSITIVES AND NEGATIVES     Objective:    Physical Exam Vitals and nursing note reviewed.  Constitutional:      Appearance: Normal appearance.  Abdominal:     General: Abdomen is flat. Bowel sounds are normal.     Palpations: Abdomen is soft.  Genitourinary:    Rectum: External hemorrhoid (6 o'clock position. Non-thrombosed. Non-strangulated.) present.  Neurological:     Mental Status: She is alert.    BP 129/89   Pulse 86   Temp 97.9 F (36.6 C)   Ht 5' (1.524  m)   Wt 141 lb (64 kg)   SpO2 99%   BMI 27.54 kg/m  Wt Readings from Last 3 Encounters:  07/28/21 141 lb (64 kg)  10/07/20 140 lb 3.2 oz (63.6 kg)  09/29/20 142 lb (64.4 kg)    Health Maintenance Due  Topic Date Due   Hepatitis C Screening  Never done   COVID-19 Vaccine (3 - Booster for Moderna series) 08/17/2020   Zoster Vaccines- Shingrix (1 of 2) Never done   INFLUENZA VACCINE  06/27/2021    There are no preventive care reminders to display for this patient.   Lab Results  Component Value Date   TSH 1.270 05/21/2020   Lab Results  Component Value Date   WBC 8.4 05/13/2019   HGB 12.8 05/13/2019   HCT 37.9 05/13/2019   MCV 88.2 05/13/2019   PLT 313.0 05/13/2019   Lab Results  Component Value Date   NA 136 05/13/2019   K 4.3 05/13/2019   CO2 26 05/13/2019   GLUCOSE 87 05/13/2019   BUN 17 05/13/2019   CREATININE 0.66 05/13/2019   BILITOT 0.5 05/13/2019   ALKPHOS 70 05/13/2019   AST 16 05/13/2019   ALT 17 05/13/2019   PROT 6.9 05/13/2019   ALBUMIN 4.3 05/13/2019   CALCIUM 9.4 05/13/2019   GFR 95.61 05/13/2019   Lab Results  Component Value Date   CHOL 173 05/13/2019   Lab Results  Component Value Date   HDL 51.40 05/13/2019   Lab Results  Component Value Date   LDLCALC 98 05/13/2019   Lab Results  Component Value Date   TRIG 115.0 05/13/2019   Lab Results  Component Value Date   CHOLHDL 3 05/13/2019   Lab Results  Component Value Date   HGBA1C 6.1 (H) 07/08/2011       Assessment & Plan:   Problem List Items Addressed This Visit   None Visit Diagnoses     External hemorrhoid    -  Primary        Meds ordered this encounter  Medications   hydrocortisone (ANUSOL-HC) 2.5 % rectal cream    Sig: Place 1 application rectally 2 (two) times daily.    Dispense:  30 g    Refill:  0   PLAN: -Non-surgical, conservative tx's appropriate for external hemorrhoid -Stool softener, fiber, plenty of water and exercise -Witch hazel pads,  Preparation H, Anusol cream -Recheck if any  signs of thrombosing or strangulation   Lianette Broussard M Zyara Riling, PA-C

## 2021-07-28 NOTE — Patient Instructions (Signed)
Ok to use Preparation H and Anusol cream. Keep stools soft and regular (use stool softener). Witch hazel pads can help with discomfort as well.  If you note any severe pain or purple discoloration, recheck right away.

## 2021-08-26 ENCOUNTER — Ambulatory Visit (INDEPENDENT_AMBULATORY_CARE_PROVIDER_SITE_OTHER): Payer: Commercial Managed Care - PPO | Admitting: Physician Assistant

## 2021-08-26 ENCOUNTER — Encounter (HOSPITAL_BASED_OUTPATIENT_CLINIC_OR_DEPARTMENT_OTHER): Payer: Self-pay | Admitting: Emergency Medicine

## 2021-08-26 ENCOUNTER — Emergency Department (HOSPITAL_BASED_OUTPATIENT_CLINIC_OR_DEPARTMENT_OTHER)
Admission: EM | Admit: 2021-08-26 | Discharge: 2021-08-26 | Disposition: A | Discharge status: Home / Self Care | Payer: Commercial Managed Care - PPO | Attending: Emergency Medicine | Admitting: Emergency Medicine

## 2021-08-26 ENCOUNTER — Other Ambulatory Visit: Payer: Self-pay

## 2021-08-26 ENCOUNTER — Emergency Department (HOSPITAL_BASED_OUTPATIENT_CLINIC_OR_DEPARTMENT_OTHER): Payer: Commercial Managed Care - PPO

## 2021-08-26 ENCOUNTER — Encounter: Payer: Self-pay | Admitting: Physician Assistant

## 2021-08-26 VITALS — BP 149/98 | HR 91 | Temp 98.0°F | Ht 60.0 in | Wt 142.0 lb

## 2021-08-26 DIAGNOSIS — J4520 Mild intermittent asthma, uncomplicated: Secondary | ICD-10-CM | POA: Diagnosis not present

## 2021-08-26 DIAGNOSIS — K5792 Diverticulitis of intestine, part unspecified, without perforation or abscess without bleeding: Secondary | ICD-10-CM

## 2021-08-26 DIAGNOSIS — K5732 Diverticulitis of large intestine without perforation or abscess without bleeding: Secondary | ICD-10-CM | POA: Diagnosis not present

## 2021-08-26 DIAGNOSIS — R1030 Lower abdominal pain, unspecified: Secondary | ICD-10-CM | POA: Diagnosis not present

## 2021-08-26 DIAGNOSIS — R10829 Rebound abdominal tenderness, unspecified site: Secondary | ICD-10-CM | POA: Diagnosis not present

## 2021-08-26 DIAGNOSIS — R109 Unspecified abdominal pain: Secondary | ICD-10-CM | POA: Diagnosis present

## 2021-08-26 LAB — POC URINALSYSI DIPSTICK (AUTOMATED)
Bilirubin, UA: NEGATIVE
Blood, UA: 2
Glucose, UA: NEGATIVE
Ketones, UA: NEGATIVE
Leukocytes, UA: NEGATIVE
Nitrite, UA: NEGATIVE
Protein, UA: NEGATIVE
Spec Grav, UA: 1.015 (ref 1.010–1.025)
Urobilinogen, UA: 0.2 E.U./dL
pH, UA: 6 (ref 5.0–8.0)

## 2021-08-26 LAB — LIPASE, BLOOD: Lipase: 29 U/L (ref 11–51)

## 2021-08-26 LAB — COMPREHENSIVE METABOLIC PANEL
ALT: 20 U/L (ref 0–44)
AST: 18 U/L (ref 15–41)
Albumin: 4.3 g/dL (ref 3.5–5.0)
Alkaline Phosphatase: 83 U/L (ref 38–126)
Anion gap: 10 (ref 5–15)
BUN: 11 mg/dL (ref 6–20)
CO2: 24 mmol/L (ref 22–32)
Calcium: 10 mg/dL (ref 8.9–10.3)
Chloride: 103 mmol/L (ref 98–111)
Creatinine, Ser: 0.63 mg/dL (ref 0.44–1.00)
GFR, Estimated: >60 mL/min (ref >60)
Glucose, Bld: 92 mg/dL (ref 70–99)
Potassium: 3.9 mmol/L (ref 3.5–5.1)
Sodium: 137 mmol/L (ref 135–145)
Total Bilirubin: 0.6 mg/dL (ref 0.3–1.2)
Total Protein: 7.6 g/dL (ref 6.5–8.1)

## 2021-08-26 LAB — CBC
HCT: 37.8 % (ref 36.0–46.0)
Hemoglobin: 13 g/dL (ref 12.0–15.0)
MCH: 29.6 pg (ref 26.0–34.0)
MCHC: 34.4 g/dL (ref 30.0–36.0)
MCV: 86.1 fL (ref 80.0–100.0)
Platelets: 356 10*3/uL (ref 150–400)
RBC: 4.39 MIL/uL (ref 3.87–5.11)
RDW: 12.4 % (ref 11.5–15.5)
WBC: 12 10*3/uL — ABNORMAL HIGH (ref 4.0–10.5)
nRBC: 0 % (ref 0.0–0.2)

## 2021-08-26 MED ORDER — AMOXICILLIN-POT CLAVULANATE 875-125 MG PO TABS: 1.0000 | ORAL_TABLET | Freq: Two times a day (BID) | ORAL | 0 refills | Status: AC

## 2021-08-26 MED ORDER — IOHEXOL 350 MG/ML SOLN
60.0000 mL | Freq: Once | INTRAVENOUS | Status: AC | PRN
  Administered 2021-08-26: 60 mL via INTRAVENOUS

## 2021-08-26 NOTE — ED Provider Notes (Signed)
Emergency Medicine Provider Triage Evaluation Note  REBECCAH IVINS , a 50 y.o. female  was evaluated in triage.  Pt complains of left-sided abdominal pain, waxing and waning in severity.  No urinary symptoms.  History of diverticulitis and kidney stones.  Sent by PCP  Review of Systems  Positive: Abd pain Negative: Urinary sxs  Physical Exam  BP (!) 142/103 (BP Location: Right Arm)   Pulse 78   Temp 98.1 F (36.7 C)   Resp 14   Ht 5' (1.524 m)   Wt 64.4 kg   SpO2 100%   BMI 27.73 kg/m  Gen:   Awake, no distress   Resp:  Normal effort  MSK:   Moves extremities without difficulty  Other:  Mild TTP of left lower quadrant.  No CVA tenderness.  Abdomen is soft.  Medical Decision Making  Medically screening exam initiated at 12:14 PM.  Appropriate orders placed.  DEBE ANFINSON was informed that the remainder of the evaluation will be completed by another provider, this initial triage assessment does not replace that evaluation, and the importance of remaining in the ED until their evaluation is complete.  Labs and CT   Franchot Heidelberg, PA-C 08/26/21 1215    Regan Lemming, MD 08/26/21 1610

## 2021-08-26 NOTE — Progress Notes (Signed)
Subjective:    Patient ID: [name], female    DOB: [birth year], 50 y.o.   MRN: [id number]  Chief Complaint  Patient presents with   Abdominal Pain    Abdominal Pain  Patient is in today for lower abdominal pain. Almost drove herself to ED last night. Cramping pain last night that actually made her stop talking. Decreased appetite in the last 24 hours. Some nausea, no vomiting. She has had yogurt and that's about it. No blood in stool. Some looser stools than normal. No body aches. No urinary symptoms. 8/10 pain last night. 2/10 discomfort currently in office.   "Stomach issues" for the last 3 weeks, meaning back and forth between constipation and diarrhea.   She has passed a renal stone previously & says this felt similar in the sense that pain was coming and going. Hx of diverticulosis. Hx partial hysterectomy, still has ovaries.  Colonoscopy is UTD.    Past Medical History:  Diagnosis Date   Anemia    Asthma    Cesarean delivery delivered    DIVERTICULITIS, HX OF 12/24/2007   DIVERTICULOSIS-COLON 07/21/2009   Hematuria, microscopic 10/26/2017   Chronic, since teens; never cystoscopy.   Irritable bowel syndrome 07/21/2009   Kidney stones    Migraine    Seasonal allergies    URINARY INCONTINENCE 06/15/2008    Past Surgical History:  Procedure Laterality Date   CESAREAN SECTION     COLONOSCOPY  04/19/2009   diverticulosis    G2 P2     1 c-section, 1 NSVD   LEAP  98   rectocele/cystocele repair  06/2009   right fibula fx     age 8   TOTAL VAGINAL HYSTERECTOMY  06/2009   has her ovaries, and SSF, sling    Family History  Problem Relation Age of Onset   Hyperlipidemia Mother    Hypertension Mother    Colon polyps Mother    Diabetes Maternal Geophysicist/field seismologist    Healthy Daughter    Healthy Daughter    Colon cancer Neg Hx    Asthma Neg Hx    Esophageal cancer Neg Hx    Stomach cancer Neg Hx    Pancreatic cancer Neg Hx    Liver disease Neg Hx     Social  History   Tobacco Use   Smoking status: Never   Smokeless tobacco: Never   Tobacco comments:    smoked socially in college  Vaping Use   Vaping Use: Never used  Substance Use Topics   Alcohol use: Yes    Comment: glass of wine x 2 week   Drug use: No     No Known Allergies  Review of Systems  Gastrointestinal:  Positive for abdominal pain.  REFER TO HPI FOR PERTINENT POSITIVES AND NEGATIVES      Objective:     BP (!) 149/98   Pulse 91   Temp 98 F (36.7 C)   Ht 5' (1.524 m)   Wt 142 lb (64.4 kg)   SpO2 97%   BMI 27.73 kg/m   Wt Readings from Last 3 Encounters:  08/26/21 142 lb (64.4 kg)  07/28/21 141 lb (64 kg)  10/07/20 140 lb 3.2 oz (63.6 kg)    BP Readings from Last 3 Encounters:  08/26/21 (!) 149/98  07/28/21 129/89  10/07/20 122/74     Physical Exam Vitals and nursing note reviewed.  Constitutional:      Appearance: Normal appearance. She is normal weight. She is not  toxic-appearing.  HENT:     Head: Normocephalic and atraumatic.     Right Ear: Tympanic membrane, ear canal and external ear normal.     Left Ear: Tympanic membrane, ear canal and external ear normal.     Nose: Nose normal.     Mouth/Throat:     Mouth: Mucous membranes are moist.  Eyes:     Extraocular Movements: Extraocular movements intact.     Conjunctiva/sclera: Conjunctivae normal.     Pupils: Pupils are equal, round, and reactive to light.  Cardiovascular:     Rate and Rhythm: Normal rate and regular rhythm.     Pulses: Normal pulses.     Heart sounds: Normal heart sounds.  Pulmonary:     Effort: Pulmonary effort is normal.     Breath sounds: Normal breath sounds.  Abdominal:     General: Abdomen is flat. Bowel sounds are normal.     Palpations: Abdomen is soft.     Tenderness: There is abdominal tenderness in the suprapubic area and left lower quadrant. There is guarding and rebound. There is no right CVA tenderness or left CVA tenderness. Negative signs include  Murphy's sign and McBurney's sign.  Musculoskeletal:        General: Normal range of motion.     Cervical back: Normal range of motion and neck supple.  Skin:    General: Skin is warm and dry.  Neurological:     General: No focal deficit present.     Mental Status: She is alert and oriented to person, place, and time.  Psychiatric:        Mood and Affect: Mood normal.        Behavior: Behavior normal.        Thought Content: Thought content normal.        Judgment: Judgment normal.       Assessment & Plan:   Problem List Items Addressed This Visit   None Visit Diagnoses     Rebound tenderness    -  Primary   Lower abdominal pain       Relevant Orders   POCT Urinalysis Dipstick (Automated)       1. Rebound tenderness 2. Lower abdominal pain +Rebound tenderness, peritoneal signs on physical exam. -Recommend urgent imaging and evaluation at nearest ED. -Pt is agreeable and feels stable to drive herself there right away. -UA 2+ blood otherwise clear, pt states this has been normal for her for years.    This note was prepared with assistance of Systems analyst. Occasional wrong-word or sound-a-like substitutions may have occurred due to the inherent limitations of voice recognition software.  Time Spent: 30 minutes of total time was spent on the date of the encounter performing the following actions: chart review prior to seeing the patient, obtaining history, performing a medically necessary exam, counseling on the treatment plan, placing orders, and documenting in our EHR.    Karey Stucki M Felesia Stahlecker, PA-C

## 2021-08-26 NOTE — ED Provider Notes (Signed)
[facility] Provider Note   CSN: [id number] Arrival date & time: 08/26/21  1139     History Chief Complaint  Patient presents with   Abdominal Pain    [name] is a 50 y.o. female presenting for evaluation of abdominal pain.  Patient states she has had left abdominal pain intermittently for the past few days.  Waxes and wanes in terms of severity.  Last night pain was severe.  She saw her PCP today, who sent her to the ER for further evaluation.  Patient reports a history of diverticulitis and kidney stones, states this feels similar to both.  No fevers or chills.  Nausea, no vomiting.  She denies urinary symptoms.  No normal bowel movements.  Her pain right now is mild, described more as a discomfort.  She reports a C-section and hysterectomy, no other abdominal surgeries.  No fevers or chills.  No chest pain or shortness of breath.  Additional history.  From chart review.  Patient with a history of anemia, asthma, diverticulosis, IBS, kidney stones, migraine.  I reviewed patient's recent PCP note, there was concern for rebound.  HPI     Past Medical History:  Diagnosis Date   Anemia    Asthma    Cesarean delivery delivered    DIVERTICULITIS, HX OF 12/24/2007   DIVERTICULOSIS-COLON 07/21/2009   Hematuria, microscopic 10/26/2017   Chronic, since teens; never cystoscopy.   Irritable bowel syndrome 07/21/2009   Kidney stones    Migraine    Seasonal allergies    URINARY INCONTINENCE 06/15/2008    Patient Active Problem List   Diagnosis Date Noted   History of diverticulitis 05/13/2019   Hematuria, microscopic 10/26/2017   Mild intermittent asthma 01/29/2012   Allergic rhinitis 09/15/2011   Irritable bowel syndrome 07/21/2009    Past Surgical History:  Procedure Laterality Date   CESAREAN SECTION     COLONOSCOPY  04/19/2009   diverticulosis    G2 P2     1 c-section, 1 NSVD   LEAP  98   rectocele/cystocele repair  06/2009   right  fibula fx     age 89   TOTAL VAGINAL HYSTERECTOMY  06/2009   has her ovaries, and SSF, sling     OB History     Gravida  2   Para  2   Term  2   Preterm      AB      Living  2      SAB      IAB      Ectopic      Multiple      Live Births              Family History  Problem Relation Age of Onset   Hyperlipidemia Mother    Hypertension Mother    Colon polyps Mother    Diabetes Maternal Geophysicist/field seismologist    Healthy Daughter    Healthy Daughter    Colon cancer Neg Hx    Asthma Neg Hx    Esophageal cancer Neg Hx    Stomach cancer Neg Hx    Pancreatic cancer Neg Hx    Liver disease Neg Hx     Social History   Tobacco Use   Smoking status: Never   Smokeless tobacco: Never   Tobacco comments:    smoked socially in college  Vaping Use   Vaping Use: Never used  Substance Use Topics   Alcohol use: Yes  Comment: glass of wine x 2 week   Drug use: No    Home Medications Prior to Admission medications   Medication Sig Start Date End Date Taking? Authorizing Provider  amoxicillin-clavulanate (AUGMENTIN) 875-125 MG tablet Take 1 tablet by mouth every 12 (twelve) hours for 10 days. 08/26/21 09/05/21 Yes Yousef Huge, PA-C  hydrocortisone (ANUSOL-HC) 2.5 % rectal cream Place 1 application rectally 2 (two) times daily. 07/28/21   [place]ward[place], Randa Evens, PA-C    Allergies    Patient has no known allergies.  Review of Systems   Review of Systems  Gastrointestinal:  Positive for abdominal pain and nausea.  All other systems reviewed and are negative.  Physical Exam Updated Vital Signs BP (!) 164/99 (BP Location: Right Arm)   Pulse 71   Temp 98.1 F (36.7 C)   Resp 16   Ht 5' (1.524 m)   Wt 64.4 kg   SpO2 100%   BMI 27.73 kg/m   Physical Exam Vitals and nursing note reviewed.  Constitutional:      General: She is not in acute distress.    Appearance: Normal appearance.     Comments: nontoxic  HENT:     Head: Normocephalic and atraumatic.   Eyes:     Conjunctiva/sclera: Conjunctivae normal.     Pupils: Pupils are equal, round, and reactive to light.  Cardiovascular:     Rate and Rhythm: Normal rate and regular rhythm.     Pulses: Normal pulses.  Pulmonary:     Effort: Pulmonary effort is normal. No respiratory distress.     Breath sounds: Normal breath sounds. No wheezing.     Comments: Speaking in full sentences.  Clear lung sounds in all fields. Abdominal:     General: There is no distension.     Palpations: Abdomen is soft. There is no mass.     Tenderness: There is abdominal tenderness. There is no guarding or rebound.     Comments: Mild tenderness palpation of the left lower quadrant abdomen.  No rigidity, guarding, distention.  Negative rebound on my exam. No peritonitis  Musculoskeletal:        General: Normal range of motion.     Cervical back: Normal range of motion and neck supple.  Skin:    General: Skin is warm and dry.     Capillary Refill: Capillary refill takes less than 2 seconds.  Neurological:     Mental Status: She is alert and oriented to person, place, and time.  Psychiatric:        Mood and Affect: Mood and affect normal.        Speech: Speech normal.        Behavior: Behavior normal.    ED Results / Procedures / Treatments   Labs (all labs ordered are listed, but only abnormal results are displayed) Labs Reviewed  CBC - Abnormal; Notable for the following components:      Result Value   WBC 12.0 (*)    All other components within normal limits  LIPASE, BLOOD  COMPREHENSIVE METABOLIC PANEL    EKG None  Radiology CT ABDOMEN PELVIS W CONTRAST  Result Date: 08/26/2021 CLINICAL DATA:  Severe abdominal pain, history of diverticulitis EXAM: CT ABDOMEN AND PELVIS WITH CONTRAST TECHNIQUE: Multidetector CT imaging of the abdomen and pelvis was performed using the standard protocol following bolus administration of intravenous contrast. CONTRAST:  51mL OMNIPAQUE IOHEXOL 350 MG/ML SOLN  COMPARISON:  11/22/2005 FINDINGS: Lower chest: No acute abnormality. Hepatobiliary: No focal liver  abnormality is seen. No gallstones, gallbladder wall thickening, or biliary dilatation. Pancreas: Unremarkable. No pancreatic ductal dilatation or surrounding inflammatory changes. Spleen: Normal in size without focal abnormality. Adrenals/Urinary Tract: Adrenal glands are unremarkable. Kidneys are normal, without renal calculi, focal lesion, or hydronephrosis. Bladder is unremarkable. Stomach/Bowel: Negative for bowel obstruction, significant dilatation, ileus, or free air. Normal appendix. Left hemipelvis sigmoid colon wall thickening with diverticular changes and trace focal pericolonic strandy edema/inflammation compatible with mild acute sigmoid diverticulitis. No evidence of free air, perforation, fluid collection, obstruction or abscess. No ascites. Vascular/Lymphatic: Tortuous aorta without aneurysm or acute finding. Mesenteric and renal vasculature all remain patent. No veno-occlusive process. No adenopathy. Reproductive: Previous hysterectomy. Stable prominence of the left ovary with surrounding prominent ovarian veins. Findings remains nonspecific. No other adnexal abnormality. No free fluid or fluid collection. Other: No abdominal wall hernia or abnormality. No abdominopelvic ascites. Musculoskeletal: No acute osseous finding. IMPRESSION: Left lower quadrant mild focal acute sigmoid diverticulitis overlying the left iliac vessels and prominent left ovary. No other complicating feature including perforation, obstruction or abscess. Electronically Signed   By: Jerilynn Mages.  Shick M.D.   On: 08/26/2021 14:16    Procedures Procedures   Medications Ordered in ED Medications  iohexol (OMNIPAQUE) 350 MG/ML injection 60 mL (60 mLs Intravenous Contrast Given 08/26/21 1328)    ED Course  I have reviewed the triage vital signs and the nursing notes.  Pertinent labs & imaging results that were available during my  care of the patient were reviewed by me and considered in my medical decision making (see chart for details).    MDM Rules/Calculators/A&P                           Patient presenting for evaluation of abdominal pain.  On exam, patient appears nontoxic.  Her pain is improved prior to arrival to the ER.  No rebound on my exam, she does have some mild left lower quadrant tenderness.  Consider diverticulitis versus IBS versus kidney stone.  Will obtain labs and CT abdomen pelvis for further evaluation.  Urine obtained in PCP clinic today, reviewed by me and negative for infection.  Labs interpreted by me, shows mild leukocytosis, otherwise reassuring.  CT pending.  CT consistent with acute diverticulitis.  No other acute or abnormal findings.  Discussed with patient.  Discussed taking antibiotics as prescribed.  Offered pain and nausea control, patient declined.  Encourage close follow-up with her GI doctor.  At this time, patient appears safe for discharge.  Return precautions given.  Patient states she understands and agrees to plan.   Final Clinical Impression(s) / ED Diagnoses Final diagnoses:  Diverticulitis    Rx / DC Orders ED Discharge Orders          Ordered    amoxicillin-clavulanate (AUGMENTIN) 875-125 MG tablet  Every 12 hours        08/26/21 [place], Tammie Ellsworth, PA-C 08/26/21 1448    Regan Lemming, MD 08/26/21 1609

## 2021-08-26 NOTE — ED Triage Notes (Signed)
Pt reports severe abdominal pain since last night.  Pt h/o diverticulitis and has been having pains off and on for 3 weeks.  Advised by PMD to come to ED for evaluation.

## 2021-08-26 NOTE — Discharge Instructions (Addendum)
Take antibiotics as prescribed.  Take entire course, even if symptoms improve. Make sure you stay well-hydrated water. Use Tylenol ibuprofen as needed for pain. Follow-up with your GI doctor for further evaluation of your symptoms. Return to the emergency room if develop high fevers, persistent vomiting, severe worsening pain, or any new, worsening, or concerning symptoms

## 2021-08-26 NOTE — Patient Instructions (Signed)
Please go to [facility] ED for labs and CT abdomen.

## 2021-10-13 ENCOUNTER — Ambulatory Visit: Payer: Commercial Managed Care - PPO | Admitting: Obstetrics and Gynecology

## 2021-10-17 NOTE — Progress Notes (Signed)
50 y.o. G11P2002 Married Other or two or more races Hispanic or Latino female here for annual exam.  H/O TAH, A&P repair in 2010.    She has had a hemorrhoid for a long time, it's gotten bigger. Not painful, doesn't bleed. Sometimes when she strains it gets worse. Has issues about constipation. Needs to go back to GI.   No bladder c/o.   Sexually active, occasional deep discomfort. Feels she is more sensitive "around the time of ovulation". Also has had issues with diverticulitis and can have discomfort from that.   No vasomotor symptoms or vaginal dryness.   No LMP recorded. Patient has had a hysterectomy.          Sexually active: Yes.    The current method of family planning is status post hysterectomy.    Exercising: Yes.     Smoker:  no  Health Maintenance: Pap:  10-07-20 normal, negative hpv History of abnormal Pap:  yes, H/O LEEP in 1998 MMG:  12-28-20 normal BMD:   Never Colonoscopy: 09-29-20 sessile polyp, f/u 5 years TDaP:  2016 Gardasil: N/A   reports that she has quit smoking. Her smoking use included cigarettes. She has never used smokeless tobacco. She reports current alcohol use. She reports that she does not use drugs. Works in [employer]. Daughters are 29 and 69, splits custody with her ex.   Past Medical History:  Diagnosis Date   Anemia    Asthma    Cesarean delivery delivered    DIVERTICULITIS, HX OF 12/24/2007   DIVERTICULOSIS-COLON 07/21/2009   Hematuria, microscopic 10/26/2017   Chronic, since teens; never cystoscopy.   Irritable bowel syndrome 07/21/2009   Kidney stones    Migraine    Seasonal allergies    URINARY INCONTINENCE 06/15/2008    Past Surgical History:  Procedure Laterality Date   CESAREAN SECTION     COLONOSCOPY  04/19/2009   diverticulosis    G2 P2     1 c-section, 1 NSVD   LEAP  98   rectocele/cystocele repair  06/2009   right fibula fx     age 82   TOTAL VAGINAL HYSTERECTOMY  06/2009   has her ovaries, and SSF, sling    Current  Outpatient Medications  Medication Sig Dispense Refill   hydrocortisone (ANUSOL-HC) 2.5 % rectal cream Place 1 application rectally 2 (two) times daily. 30 g 0   No current facility-administered medications for this visit.   Facility-Administered Medications Ordered in Other Visits  Medication Dose Route Frequency Provider Last Rate Last Admin   methacholine (PROVOCHOLINE) inhaler solution 2 mg  2 mL Inhalation Once Brand Males, MD       Followed by   methacholine (PROVOCHOLINE) inhaler solution 8 mg  2 mL Inhalation Once Brand Males, MD       Followed by   methacholine (PROVOCHOLINE) inhaler solution 32 mg  2 mL Inhalation Once Brand Males, MD       Followed by   albuterol (PROVENTIL) (5 MG/ML) 0.5% nebulizer solution 2.5 mg  2.5 mg Nebulization Once Brand Males, MD        Family History  Problem Relation Age of Onset   Hyperlipidemia Mother    Hypertension Mother    Colon polyps Mother    Diabetes Maternal Grandmother    Healthy Daughter    Healthy Daughter    Colon cancer Neg Hx    Asthma Neg Hx    Esophageal cancer Neg Hx    Stomach cancer Neg Hx  Pancreatic cancer Neg Hx    Liver disease Neg Hx     Review of Systems  All other systems reviewed and are negative.  Exam:   BP 124/76 (BP Location: Right Arm, Patient Position: Sitting, Cuff Size: Normal)   Pulse 78   Ht 4' 11.5" (1.511 m)   Wt 143 lb (64.9 kg)   SpO2 98%   BMI 28.40 kg/m   Weight change: @WEIGHTCHANGE @ Height:   Height: 4' 11.5" (151.1 cm)  Ht Readings from Last 3 Encounters:  10/28/21 4' 11.5" (1.511 m)  08/26/21 5' (1.524 m)  08/26/21 5' (1.524 m)    General appearance: alert, cooperative and appears stated age Head: Normocephalic, without obvious abnormality, atraumatic Neck: no adenopathy, supple, symmetrical, trachea midline and thyroid normal to inspection and palpation Lungs: clear to auscultation bilaterally Cardiovascular: regular rate and rhythm Breasts:  normal appearance, no masses or tenderness Abdomen: soft, non-tender; non distended,  no masses,  no organomegaly Extremities: extremities normal, atraumatic, no cyanosis or edema Skin: Skin color, texture, turgor normal. No rashes or lesions Lymph nodes: Cervical, supraclavicular, and axillary nodes normal. No abnormal inguinal nodes palpated Neurologic: Grossly normal   Pelvic: External genitalia:  no lesions              Urethra:  normal appearing urethra with no masses, tenderness or lesions              Bartholins and Skenes: normal                 Vagina: normal appearing vagina with normal color and discharge, no lesions. Well estrogenized              Cervix: absent               Bimanual Exam:  Uterus:  uterus absent              Adnexa: no mass, fullness, tenderness               Rectovaginal: Confirms               Anus:  normal sphincter tone, anal tag noted no current hemorrhoid  Caryn Bee chaperoned for the exam.   1. Well woman exam No pap this year Colonoscopy UTD Mammogram due 2/23 Discussed breast self exam Discussed calcium and vit D intake Recent CBC and CMP No treatment recommended for her anal tag.   2. Laboratory exam ordered as part of routine general medical examination Other labs UTD - Lipid panel

## 2021-10-28 ENCOUNTER — Encounter: Payer: Self-pay | Admitting: Obstetrics and Gynecology

## 2021-10-28 ENCOUNTER — Other Ambulatory Visit: Payer: Self-pay

## 2021-10-28 ENCOUNTER — Ambulatory Visit (INDEPENDENT_AMBULATORY_CARE_PROVIDER_SITE_OTHER): Payer: Commercial Managed Care - PPO | Admitting: Obstetrics and Gynecology

## 2021-10-28 VITALS — BP 124/76 | HR 78 | Ht 59.5 in | Wt 143.0 lb

## 2021-10-28 DIAGNOSIS — Z0000 Encounter for general adult medical examination without abnormal findings: Secondary | ICD-10-CM

## 2021-10-28 DIAGNOSIS — Z01419 Encounter for gynecological examination (general) (routine) without abnormal findings: Secondary | ICD-10-CM

## 2021-10-28 NOTE — Patient Instructions (Signed)

## 2021-10-29 LAB — LIPID PANEL
Cholesterol: 198 mg/dL (ref <200)
HDL: 57 mg/dL (ref >=50)
LDL Cholesterol (Calc): 122 mg/dL (calc) — ABNORMAL HIGH
Non-HDL Cholesterol (Calc): 141 mg/dL (calc) — ABNORMAL HIGH (ref <130)
Total CHOL/HDL Ratio: 3.5 (calc) (ref <5.0)
Triglycerides: 93 mg/dL (ref <150)

## 2021-12-29 LAB — HM MAMMOGRAPHY: HM Mammogram: NORMAL (ref 0–4)

## 2022-01-13 ENCOUNTER — Encounter: Payer: Self-pay | Admitting: Physician Assistant

## 2022-02-25 IMAGING — CT CT ABD-PELV W/ CM
2 of 5 series · 16 of 46 positions shown, 18 images · IV contrast (APPLIED)
Comparison: 11/22/2005

CLINICAL DATA: Severe abdominal pain, history of diverticulitis

EXAM:
CT ABDOMEN AND PELVIS WITH CONTRAST
TECHNIQUE: Multidetector CT imaging of the abdomen and pelvis was performed
using the standard protocol following bolus administration of
intravenous contrast.
CONTRAST:  60mL OMNIPAQUE IOHEXOL 350 MG/ML SOLN

[Series 2: abd pel w · axial · 0.72mm/px · z∈[+754,+1139]mm · 13 of 87 slices shown, 15 images]
[im 5/87  soft-tissue]
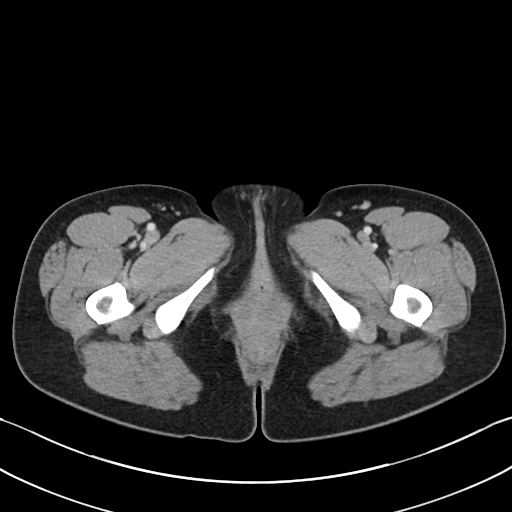
[im 5/87  bone]
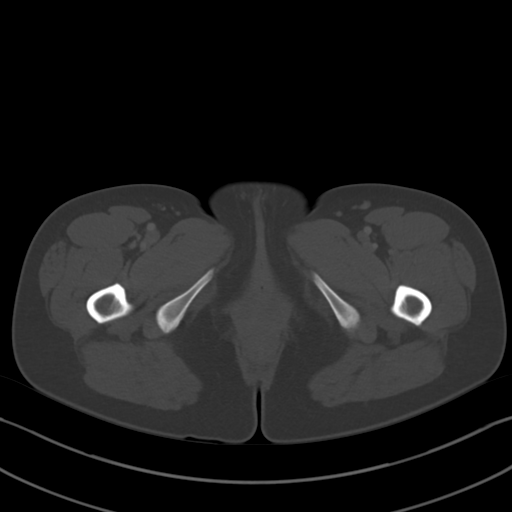
[im 10/87  soft-tissue]
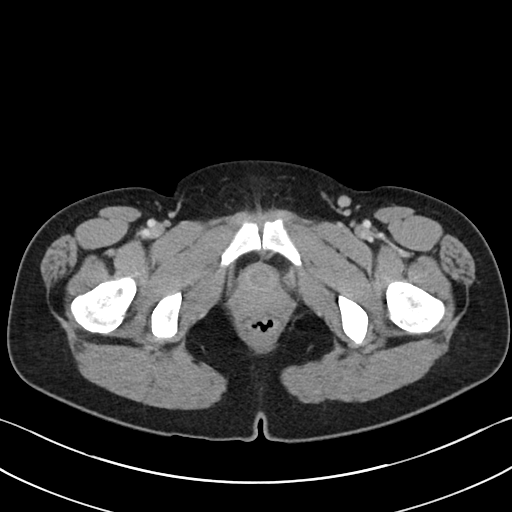
[im 20/87  soft-tissue]
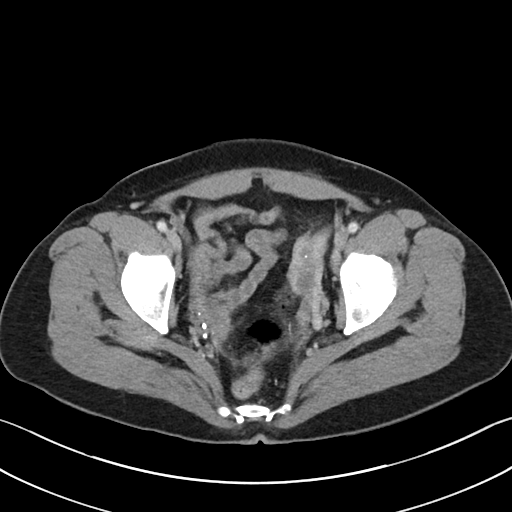
[im 24/87  soft-tissue]
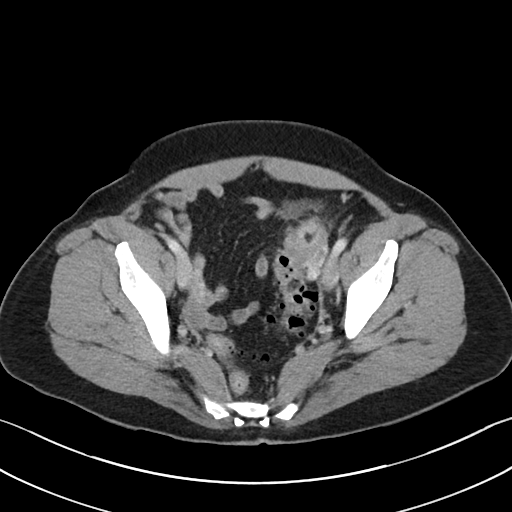
[im 29/87  soft-tissue]
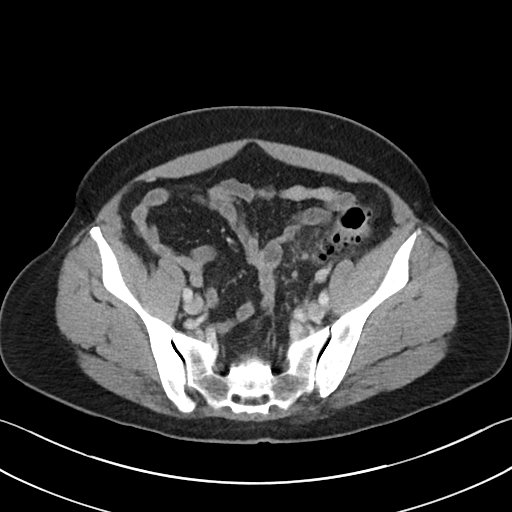
[im 39/87  soft-tissue]
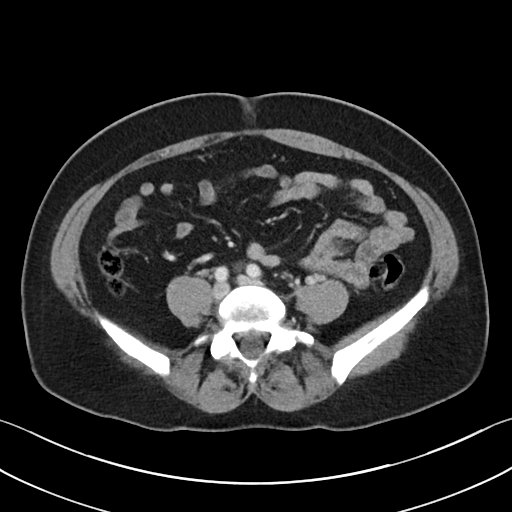
[im 44/87  soft-tissue]
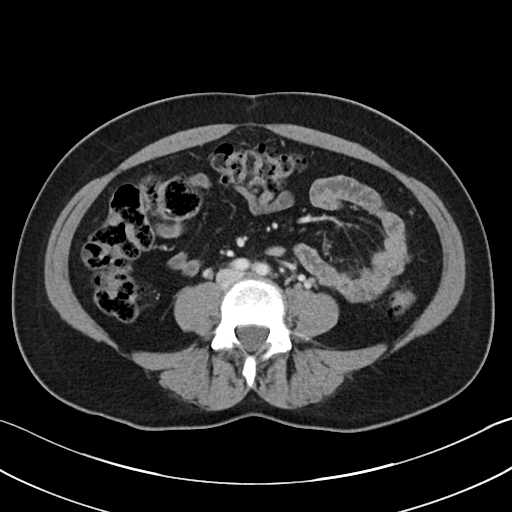
[im 48/87  soft-tissue]
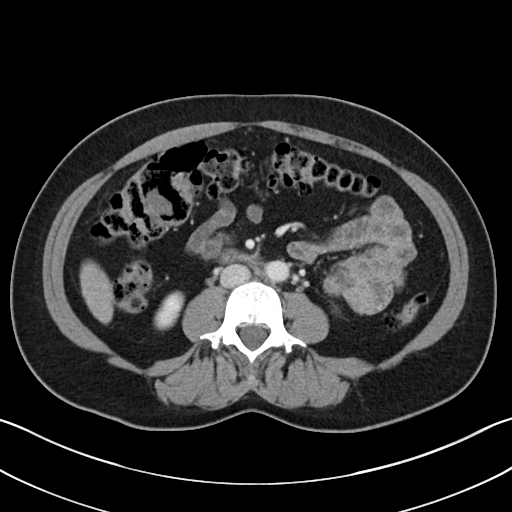
[im 58/87  soft-tissue]
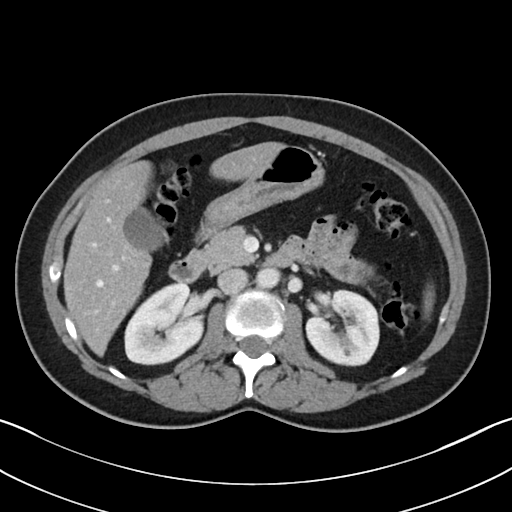
[im 58/87  bone]
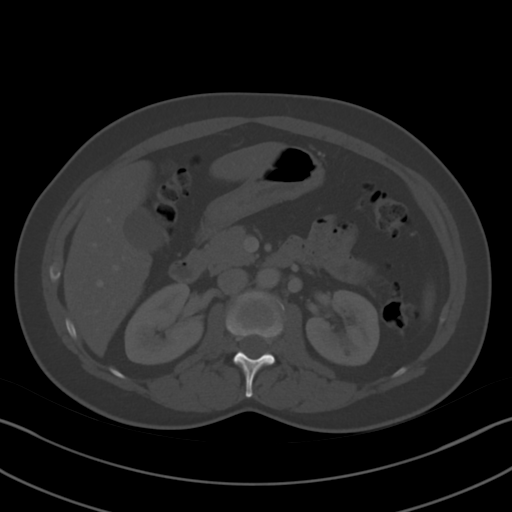
[im 63/87  soft-tissue]
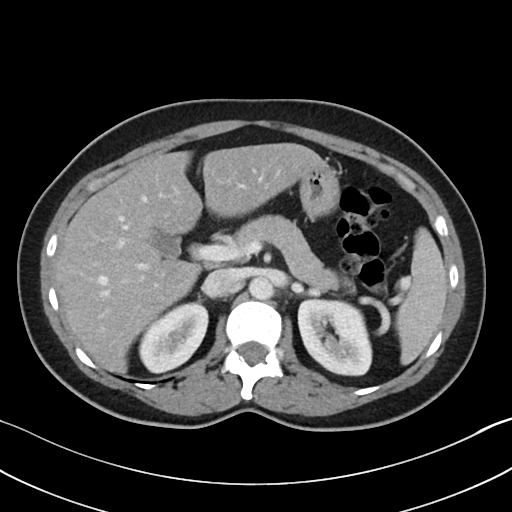
[im 67/87  soft-tissue]
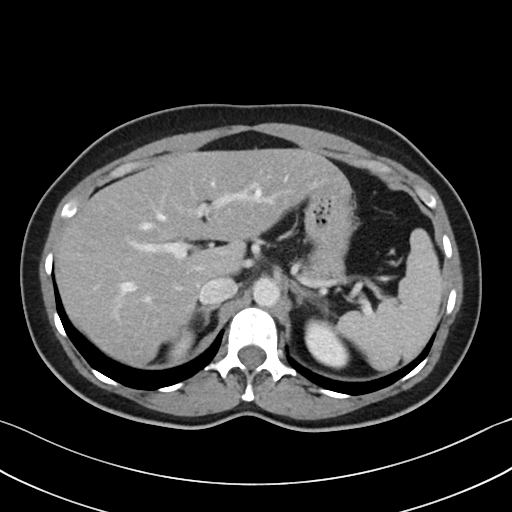
[im 77/87  soft-tissue]
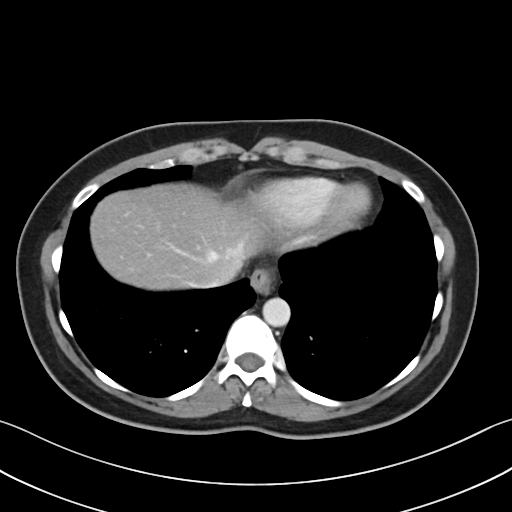
[im 82/87  soft-tissue]
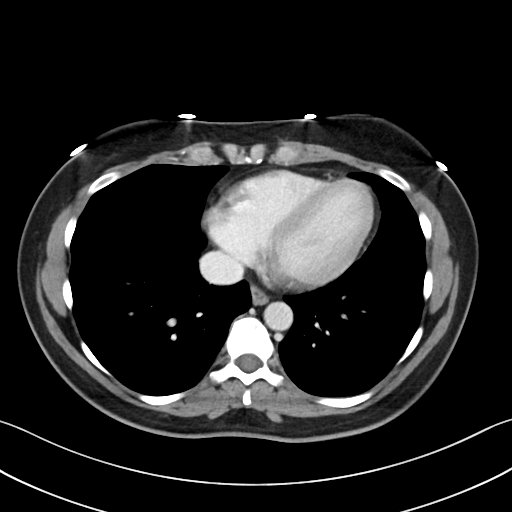

[Series 5: coronal · coronal · 0.74mm/px · 3 of 83 slices shown]
[im 28/83  soft-tissue]
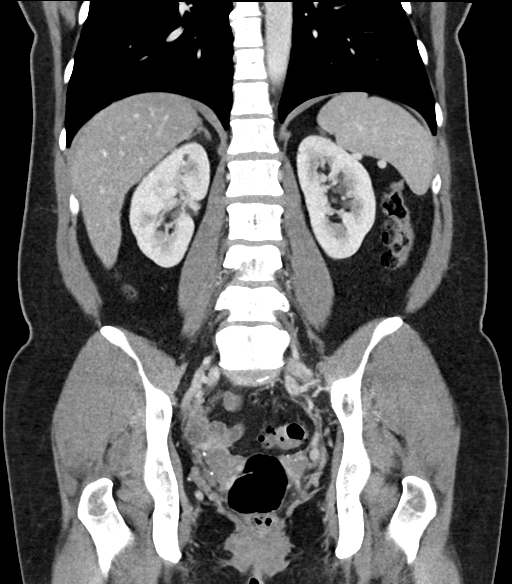
[im 37/83  soft-tissue]
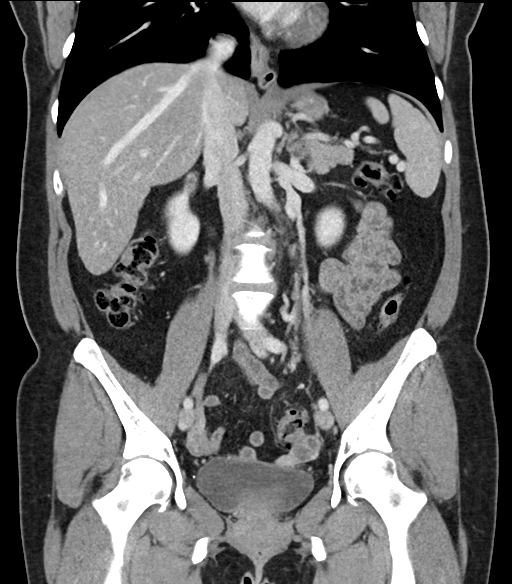
[im 46/83  soft-tissue]
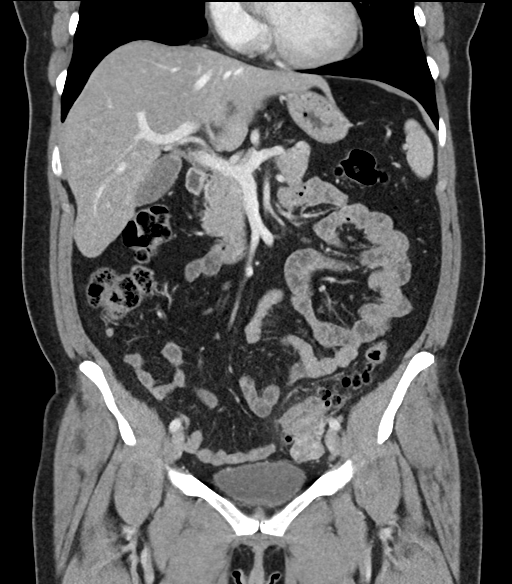

[16 of 46 positions shown; findings below may reference images not displayed]

FINDINGS: Lower chest: No acute abnormality.

Hepatobiliary: No focal liver abnormality is seen. No gallstones,
gallbladder wall thickening, or biliary dilatation.

Pancreas: Unremarkable. No pancreatic ductal dilatation or
surrounding inflammatory changes.

Spleen: Normal in size without focal abnormality.

Adrenals/Urinary Tract: Adrenal glands are unremarkable. Kidneys are
normal, without renal calculi, focal lesion, or hydronephrosis.
Bladder is unremarkable.

Stomach/Bowel: Negative for bowel obstruction, significant
dilatation, ileus, or free air. Normal appendix.

Left hemipelvis sigmoid colon wall thickening with diverticular
changes and trace focal pericolonic strandy edema/inflammation
compatible with mild acute sigmoid diverticulitis. No evidence of
free air, perforation, fluid collection, obstruction or abscess. No
ascites.

Vascular/Lymphatic: Tortuous aorta without aneurysm or acute
finding. Mesenteric and renal vasculature all remain patent. No
Tiger process. No adenopathy.

Reproductive: Previous hysterectomy. Stable prominence of the left
ovary with surrounding prominent ovarian veins. Findings remains
nonspecific. No other adnexal abnormality. No free fluid or fluid
collection.

Other: No abdominal wall hernia or abnormality. No abdominopelvic
ascites.

Musculoskeletal: No acute osseous finding.
IMPRESSION: Left lower quadrant mild focal acute sigmoid diverticulitis
overlying the left iliac vessels and prominent left ovary.

No other complicating feature including perforation, obstruction or
abscess.

## 2022-04-17 ENCOUNTER — Ambulatory Visit (INDEPENDENT_AMBULATORY_CARE_PROVIDER_SITE_OTHER): Payer: Commercial Managed Care - PPO | Admitting: Physician Assistant

## 2022-04-17 ENCOUNTER — Encounter: Payer: Self-pay | Admitting: Physician Assistant

## 2022-04-17 VITALS — BP 142/100 | HR 68 | Temp 98.2°F | Ht 59.5 in | Wt 140.2 lb

## 2022-04-17 DIAGNOSIS — M79604 Pain in right leg: Secondary | ICD-10-CM

## 2022-04-17 DIAGNOSIS — E8881 Metabolic syndrome: Secondary | ICD-10-CM

## 2022-04-17 DIAGNOSIS — M79605 Pain in left leg: Secondary | ICD-10-CM | POA: Diagnosis not present

## 2022-04-17 DIAGNOSIS — R030 Elevated blood-pressure reading, without diagnosis of hypertension: Secondary | ICD-10-CM

## 2022-04-17 LAB — SEDIMENTATION RATE: Sed Rate: 12 mm/hr (ref 0–30)

## 2022-04-17 LAB — C-REACTIVE PROTEIN: CRP: <1 mg/dL (ref 0.5–20.0)

## 2022-04-17 LAB — HEMOGLOBIN A1C: Hgb A1c MFr Bld: 5.4 % (ref 4.6–6.5)

## 2022-04-17 MED ORDER — MELOXICAM 7.5 MG PO TABS: 7.5000 mg | ORAL_TABLET | Freq: Every day | ORAL | 0 refills | Status: DC

## 2022-04-17 NOTE — Patient Instructions (Signed)
It was great to see you!  Continue to monitor your blood pressure Keep a log for [place] and let's follow-up in 3 months  EKG looks good  Let's follow-up in 1-2 months for blood pressure and blood work, sooner if you have concerns.  Take care,  Inda Coke PA-C

## 2022-04-17 NOTE — Progress Notes (Signed)
[name] is a 51 y.o. female here for a follow up of a pre-existing problem.  History of Present Illness:   Chief Complaint  Patient presents with   c/o elevated blood pressure    Pt c/o elevated blood pressure past week. [facility] and 161/103, has been checking at home 148/107, 135/96, 137/88, 152/110 last check Friday. Has been having headaches.   Leg Pain    Pt c/o bilateral leg pain worse past 2 months. Also having right sciatica pain. Hips hurt also.    HPI  Elevated blood pressure readings Currently taking no medication. At home blood pressure readings are: checked regularly. She states she has having elevated blood pressure for the past week and is concern about this issue. Her blood at home has been running high at home. Reports systolic ranging between 970-263 and diastolic 785-885 at home. She notes she had [facility] and this was running 161/103 at that time. She has been having headaches as well. Patient denies chest pain, SOB, blurred vision, lower leg swelling. Denies excessive caffeine intake, stimulant usage, excessive alcohol intake, or increase in salt consumption.  BP Readings from Last 3 Encounters:  04/17/22 (!) 142/100  10/28/21 124/76  08/26/21 (!) 147/99    Leg pain  Patient complain of bilateral leg pain that has been onset for the past 2 months. She notes her pain seems to be worsening. She has been having worsening pain at night but symptoms seems to manageable during the day. She states she has been having leg pain from the waist down. She notes she has had leg pain no matter which way she lies in bed. She has tried Tylenol and this seems to help. She is also having some hip pain which radiates down to her leg. She think this might be related to her sciatic nerve. Has had some pelvic pain. She has been following healthy diet. She has been exercising about 2-3 days a week. Denies numbness or tingling. Denies redness or swelling  in legs. She says that its not uncommon for her to have issues with pain.  Elevated LDL She had blood work done which showed mild elevated cholesterol. She notes she has been following healthy diet. She has not been eating any sugary food or fried food. She does have a family hx of hyperlipidemia.  Insulin resistance Last A1c from 2012 shows A1c of 6.1%. She is unaware of this. No personal hx of gestational DM or DM.  Past Medical History:  Diagnosis Date   Anemia    Asthma    Cesarean delivery delivered    DIVERTICULITIS, HX OF 12/24/2007   DIVERTICULOSIS-COLON 07/21/2009   Hematuria, microscopic 10/26/2017   Chronic, since teens; never cystoscopy.   Irritable bowel syndrome 07/21/2009   Kidney stones    Migraine    Seasonal allergies    URINARY INCONTINENCE 06/15/2008     Social History   Tobacco Use   Smoking status: Former    Types: Cigarettes   Smokeless tobacco: Never   Tobacco comments:    smoked socially in college  Vaping Use   Vaping Use: Never used  Substance Use Topics   Alcohol use: Yes    Comment: glass of wine x 2 week   Drug use: No    Past Surgical History:  Procedure Laterality Date   CESAREAN SECTION     COLONOSCOPY  04/19/2009   diverticulosis    G2 P2  1 c-section, 1 NSVD   LEAP  98   rectocele/cystocele repair  06/2009   right fibula fx     age 27   TOTAL VAGINAL HYSTERECTOMY  06/2009   has her ovaries, and SSF, sling    Family History  Problem Relation Age of Onset   Hyperlipidemia Mother    Hypertension Mother    Colon polyps Mother    Diabetes Maternal Geophysicist/field seismologist    Healthy Daughter    Healthy Daughter    Colon cancer Neg Hx    Asthma Neg Hx    Esophageal cancer Neg Hx    Stomach cancer Neg Hx    Pancreatic cancer Neg Hx    Liver disease Neg Hx     No Known Allergies  Current Medications:   Current Outpatient Medications:    ibuprofen (ADVIL) 200 MG tablet, Take 400 mg by mouth every 6 (six) hours as needed., Disp: ,  Rfl:  No current facility-administered medications for this visit.  Facility-Administered Medications Ordered in Other Visits:    [COMPLETED] sodium chloride 0.9 % nebulizer solution 3 mL, 3 mL, Nebulization, Once, 3 mL at 12/27/11 1503 **FOLLOWED BY** [COMPLETED] methacholine (PROVOCHOLINE) inhaler solution 0.125 mg, 2 mL, Inhalation, Once, 0.125 mg at 12/27/11 1513 **FOLLOWED BY** [COMPLETED] methacholine (PROVOCHOLINE) inhaler solution 0.5 mg, 2 mL, Inhalation, Once, 0.5 mg at 12/27/11 1512 **FOLLOWED BY** methacholine (PROVOCHOLINE) inhaler solution 2 mg, 2 mL, Inhalation, Once **FOLLOWED BY** methacholine (PROVOCHOLINE) inhaler solution 8 mg, 2 mL, Inhalation, Once **FOLLOWED BY** methacholine (PROVOCHOLINE) inhaler solution 32 mg, 2 mL, Inhalation, Once **FOLLOWED BY** albuterol (PROVENTIL) (5 MG/ML) 0.5% nebulizer solution 2.5 mg, 2.5 mg, Nebulization, Once, Brand Males, MD   Review of Systems:   ROS Negative unless otherwise specified per HPI.   Vitals:   Vitals:   04/17/22 0942  BP: (!) 142/100  Pulse: 68  Temp: 98.2 F (36.8 C)  TempSrc: Temporal  SpO2: 97%  Weight: 140 lb 4 oz (63.6 kg)  Height: 4' 11.5" (1.511 m)     Body mass index is 27.85 kg/m.  Physical Exam:   Physical Exam Vitals and nursing note reviewed.  Constitutional:      General: She is not in acute distress.    Appearance: She is well-developed. She is not ill-appearing or toxic-appearing.  Cardiovascular:     Rate and Rhythm: Normal rate and regular rhythm.     Pulses: Normal pulses.     Heart sounds: Normal heart sounds, S1 normal and S2 normal.  Pulmonary:     Effort: Pulmonary effort is normal.     Breath sounds: Normal breath sounds.  Skin:    General: Skin is warm and dry.  Neurological:     Mental Status: She is alert.     GCS: GCS eye subscore is 4. GCS verbal subscore is 5. GCS motor subscore is 6.  Psychiatric:        Speech: Speech normal.        Behavior: Behavior normal.  Behavior is cooperative.    Assessment and Plan:   Elevated blood pressure reading EKG tracing is personally reviewed.  EKG notes NSR.  No acute changes.  Above goal, however not at critical level at this time; she is currently asymptomatic She would like to request to discuss medication start at next appt Denies concerns for sleep apnea, however may consider in future BP log given Follow-up in 1-2 months, sooner if concerns Recommend close follow-up to start BP medications if needed  Pain  in both lower extremities Unclear etiology Will obtain autoimmune panel for further evaluation Low threshold to send to rheumatology Trial 7.5 mg mobic daily x 1 week to see if this helps her pain, may continue prn after that  Insulin resistance Update A1c and provide recommendations accordingly  I,Savera Zaman,acting as a scribe for Inda Coke, PA.,have documented all relevant documentation on the behalf of Inda Coke, PA,as directed by  Inda Coke, PA while in the presence of Inda Coke, [place].   I, Inda Coke, [place], have reviewed all documentation for this visit. The documentation on 04/17/22 for the exam, diagnosis, procedures, and orders are all accurate and complete.  Time spent with patient today was 45 minutes which consisted of chart review of prior cardiology and PCP notes, review of prior EKG, discussing diagnosis, work up, treatment answering questions and documentation.   Inda Coke, PA-C

## 2022-04-18 LAB — ANA: Anti Nuclear Antibody (ANA): NEGATIVE

## 2022-04-18 LAB — RHEUMATOID FACTOR: Rheumatoid fact SerPl-aCnc: <14 IU/mL (ref <14)

## 2022-05-14 ENCOUNTER — Other Ambulatory Visit: Payer: Self-pay | Admitting: Physician Assistant

## 2022-06-13 ENCOUNTER — Other Ambulatory Visit: Payer: Self-pay | Admitting: Physician Assistant

## 2022-07-14 ENCOUNTER — Other Ambulatory Visit: Payer: Self-pay | Admitting: Physician Assistant

## 2022-07-27 ENCOUNTER — Encounter: Payer: Self-pay | Admitting: Physician Assistant

## 2022-07-27 ENCOUNTER — Ambulatory Visit (INDEPENDENT_AMBULATORY_CARE_PROVIDER_SITE_OTHER): Payer: Commercial Managed Care - PPO | Admitting: Physician Assistant

## 2022-07-27 VITALS — BP 138/88 | HR 76 | Temp 97.5°F | Ht 59.5 in | Wt 137.5 lb

## 2022-07-27 DIAGNOSIS — R9389 Abnormal findings on diagnostic imaging of other specified body structures: Secondary | ICD-10-CM

## 2022-07-27 DIAGNOSIS — R030 Elevated blood-pressure reading, without diagnosis of hypertension: Secondary | ICD-10-CM

## 2022-07-27 DIAGNOSIS — M79605 Pain in left leg: Secondary | ICD-10-CM | POA: Diagnosis not present

## 2022-07-27 DIAGNOSIS — M79604 Pain in right leg: Secondary | ICD-10-CM | POA: Diagnosis not present

## 2022-07-27 LAB — URINALYSIS, ROUTINE W REFLEX MICROSCOPIC
Bilirubin Urine: NEGATIVE
Ketones, ur: NEGATIVE
Leukocytes,Ua: NEGATIVE
Nitrite: NEGATIVE
Specific Gravity, Urine: 1.015 (ref 1.000–1.030)
Total Protein, Urine: NEGATIVE
Urine Glucose: NEGATIVE
Urobilinogen, UA: 1 (ref 0.0–1.0)
pH: 7 (ref 5.0–8.0)

## 2022-07-27 LAB — CBC WITH DIFFERENTIAL/PLATELET
Basophils Absolute: 0.1 10*3/uL (ref 0.0–0.1)
Basophils Relative: 1 % (ref 0.0–3.0)
Eosinophils Absolute: 0.6 10*3/uL (ref 0.0–0.7)
Eosinophils Relative: 6 % — ABNORMAL HIGH (ref 0.0–5.0)
HCT: 38.5 % (ref 36.0–46.0)
Hemoglobin: 13 g/dL (ref 12.0–15.0)
Lymphocytes Relative: 33.4 % (ref 12.0–46.0)
Lymphs Abs: 3.4 10*3/uL (ref 0.7–4.0)
MCHC: 33.8 g/dL (ref 30.0–36.0)
MCV: 87.8 fl (ref 78.0–100.0)
Monocytes Absolute: 0.6 10*3/uL (ref 0.1–1.0)
Monocytes Relative: 5.6 % (ref 3.0–12.0)
Neutro Abs: 5.6 10*3/uL (ref 1.4–7.7)
Neutrophils Relative %: 54 % (ref 43.0–77.0)
Platelets: 319 10*3/uL (ref 150.0–400.0)
RBC: 4.38 Mil/uL (ref 3.87–5.11)
RDW: 13.3 % (ref 11.5–15.5)
WBC: 10.3 10*3/uL (ref 4.0–10.5)

## 2022-07-27 LAB — COMPREHENSIVE METABOLIC PANEL
ALT: 18 U/L (ref 0–35)
AST: 19 U/L (ref 0–37)
Albumin: 4.2 g/dL (ref 3.5–5.2)
Alkaline Phosphatase: 71 U/L (ref 39–117)
BUN: 16 mg/dL (ref 6–23)
CO2: 26 mEq/L (ref 19–32)
Calcium: 9.5 mg/dL (ref 8.4–10.5)
Chloride: 102 mEq/L (ref 96–112)
Creatinine, Ser: 0.78 mg/dL (ref 0.40–1.20)
GFR: 88.12 mL/min (ref >60.00)
Glucose, Bld: 90 mg/dL (ref 70–99)
Potassium: 4.1 mEq/L (ref 3.5–5.1)
Sodium: 136 mEq/L (ref 135–145)
Total Bilirubin: 0.4 mg/dL (ref 0.2–1.2)
Total Protein: 7.4 g/dL (ref 6.0–8.3)

## 2022-07-27 LAB — IBC + FERRITIN
Ferritin: 65 ng/mL (ref 10.0–291.0)
Iron: 81 ug/dL (ref 42–145)
Saturation Ratios: 21.9 % (ref 20.0–50.0)
TIBC: 369.6 ug/dL (ref 250.0–450.0)
Transferrin: 264 mg/dL (ref 212.0–360.0)

## 2022-07-27 LAB — CK: Total CK: 47 U/L (ref 7–177)

## 2022-07-27 LAB — VITAMIN B12: Vitamin B-12: 341 pg/mL (ref 211–911)

## 2022-07-27 LAB — TSH: TSH: 1.42 u[IU]/mL (ref 0.35–5.50)

## 2022-07-27 NOTE — Progress Notes (Signed)
[name] is a 51 y.o. female here for a follow up of a pre-existing problem.  History of Present Illness:   Chief Complaint  Patient presents with   Joint Pain    Pt is c/o joint pain all over, mainly sciatica both sides, thighs and knees.    HPI  Leg pain Went hiking at the beginning of August -- was with a group of people. Was in pain the whole time. She took the meloxicam 7.5 mg just a few times when she was going to be very busy. Feels deep aching on upper thighs. Goes to bed in pain and wakes up in pain. We did autoimmune testing in May and this was all within normal limits. Denies: swelling in legs, weakness, numbness, erythema, decreased ROM. Does get stiffness in the AM.  Elevated blood pressure reading Currently taking no medication. At home blood pressure readings are: <140/90. Patient denies chest pain, SOB, blurred vision, dizziness, unusual headaches, lower leg swelling. Denies excessive caffeine intake, stimulant usage, excessive alcohol intake, or increase in salt consumption.  BP Readings from Last 3 Encounters:  07/27/22 138/88  04/17/22 (!) 142/100  10/28/21 124/76   Prominent left ovary This was found on CT scan last year. We briefly discussed this today. She has no further pelvic pain but is wondering if this is of any significance.  Past Medical History:  Diagnosis Date   Anemia    Asthma    Cesarean delivery delivered    DIVERTICULITIS, HX OF 12/24/2007   DIVERTICULOSIS-COLON 07/21/2009   Hematuria, microscopic 10/26/2017   Chronic, since teens; never cystoscopy.   Irritable bowel syndrome 07/21/2009   Kidney stones    Migraine    Seasonal allergies    URINARY INCONTINENCE 06/15/2008     Social History   Tobacco Use   Smoking status: Former    Types: Cigarettes   Smokeless tobacco: Never   Tobacco comments:    smoked socially in college  Vaping Use   Vaping Use: Never used  Substance Use Topics   Alcohol use: Yes    Comment: glass of  wine x 2 week   Drug use: No    Past Surgical History:  Procedure Laterality Date   CESAREAN SECTION     COLONOSCOPY  04/19/2009   diverticulosis    G2 P2     1 c-section, 1 NSVD   LEAP  98   rectocele/cystocele repair  06/2009   right fibula fx     age 50   TOTAL VAGINAL HYSTERECTOMY  06/2009   has her ovaries, and SSF, sling    Family History  Problem Relation Age of Onset   Hyperlipidemia Mother    Hypertension Mother    Colon polyps Mother    Diabetes Maternal Geophysicist/field seismologist    Healthy Daughter    Healthy Daughter    Colon cancer Neg Hx    Asthma Neg Hx    Esophageal cancer Neg Hx    Stomach cancer Neg Hx    Pancreatic cancer Neg Hx    Liver disease Neg Hx     No Known Allergies  Current Medications:   Current Outpatient Medications:    ibuprofen (ADVIL) 200 MG tablet, Take 400 mg by mouth every 6 (six) hours as needed., Disp: , Rfl:    meloxicam (MOBIC) 7.5 MG tablet, TAKE 1 TABLET BY MOUTH EVERY DAY, Disp: 30 tablet, Rfl: 0 No current facility-administered medications for this visit.  Facility-Administered Medications Ordered in Other Visits:    [  COMPLETED] sodium chloride 0.9 % nebulizer solution 3 mL, 3 mL, Nebulization, Once, 3 mL at 12/27/11 1503 **FOLLOWED BY** [COMPLETED] methacholine (PROVOCHOLINE) inhaler solution 0.125 mg, 2 mL, Inhalation, Once, 0.125 mg at 12/27/11 1513 **FOLLOWED BY** [COMPLETED] methacholine (PROVOCHOLINE) inhaler solution 0.5 mg, 2 mL, Inhalation, Once, 0.5 mg at 12/27/11 1512 **FOLLOWED BY** methacholine (PROVOCHOLINE) inhaler solution 2 mg, 2 mL, Inhalation, Once **FOLLOWED BY** methacholine (PROVOCHOLINE) inhaler solution 8 mg, 2 mL, Inhalation, Once **FOLLOWED BY** methacholine (PROVOCHOLINE) inhaler solution 32 mg, 2 mL, Inhalation, Once **FOLLOWED BY** albuterol (PROVENTIL) (5 MG/ML) 0.5% nebulizer solution 2.5 mg, 2.5 mg, Nebulization, Once, Brand Males, MD   Review of Systems:   ROS Negative unless otherwise specified per  HPI.   Vitals:   Vitals:   07/27/22 0805  BP: 138/88  Pulse: 76  Temp: (!) 97.5 F (36.4 C)  TempSrc: Temporal  SpO2: 98%  Weight: 137 lb 8 oz (62.4 kg)  Height: 4' 11.5" (1.511 m)     Body mass index is 27.31 kg/m.  Physical Exam:   Physical Exam Vitals and nursing note reviewed.  Constitutional:      General: She is not in acute distress.    Appearance: She is well-developed. She is not ill-appearing or toxic-appearing.  Cardiovascular:     Rate and Rhythm: Normal rate and regular rhythm.     Pulses: Normal pulses.     Heart sounds: Normal heart sounds, S1 normal and S2 normal.  Pulmonary:     Effort: Pulmonary effort is normal.     Breath sounds: Normal breath sounds.  Musculoskeletal:     Comments: No tenderness to joints or muscles on my exam Normal ROM  Skin:    General: Skin is warm and dry.  Neurological:     Mental Status: She is alert.     GCS: GCS eye subscore is 4. GCS verbal subscore is 5. GCS motor subscore is 6.  Psychiatric:        Speech: Speech normal.        Behavior: Behavior normal. Behavior is cooperative.     Assessment and Plan:   Pain in both lower extremities Unclear etiology -- initial autoimmune work-up negative Will add additional blood work and urine studies Will refer to sports medicine and also obtain ABI's  Will defer MSK imaging to sports medicine  Elevated blood pressure reading Home readings are normotensive Continue to monitor at home and follow up if readings persistently >140/90  Abnormal CT scan Will reach out to her Ob-Gyn to see if her CT scan findings are of any significance  Inda Coke, PA-C

## 2022-07-27 NOTE — Patient Instructions (Signed)
It was great to see you!  I'm putting in a referral for you to have ultrasound of your legs to assess the blood flow.  We will get some additional blood work today.  A referral has been placed for you to see one of our fantastic providers at [facility]. Someone from their office will be in touch soon regarding scheduling your appointment.  Their location:  [place] at [facility]  [address] on the 1st floor Phone number [phone] Fax [phone].   This location is across the street from the entrance to [employer] and in the same complex as the [facility]  If a referral was placed today --> you will be contacted for an appointment. Please note that routine referrals can sometimes take up to 3-4 weeks to process. Please call our office if you haven't heard anything after this time frame.  If blood work, urine studies, or any imaging was ordered today -->  we will release your results to you on your MyChart account (if you have chosen to sign up for this) with further instructions. You may see the results before I do, but when I review them I will send you a message with my report or have my staff call you if things need to be discussed. Please reply to my message with any questions.   Take care,  Inda Coke PA-C

## 2022-07-28 ENCOUNTER — Telehealth: Payer: Self-pay | Admitting: Physician Assistant

## 2022-07-28 NOTE — Telephone Encounter (Signed)
Patient states she has some questions regarding labs from 08/31. She is requesting a call back from clinical team at [phone].

## 2022-07-28 NOTE — Telephone Encounter (Signed)
Spoke to pt asked her how can I help you? Pt said had question about Eosinophils? Told her Aldona Bar said it is fine since you do not have allergies. Also Hgb in urine. Told her Aldona Bar said nothing to worry about since RBC was normal no blood. Pt verbalized understanding.

## 2022-08-01 ENCOUNTER — Encounter: Payer: Self-pay | Admitting: Physician Assistant

## 2022-08-02 ENCOUNTER — Ambulatory Visit (HOSPITAL_COMMUNITY)
Admission: RE | Admit: 2022-08-02 | Discharge: 2022-08-02 | Disposition: A | Discharge status: Home / Self Care | Payer: Commercial Managed Care - PPO | Source: Ambulatory Visit | Attending: Physician Assistant | Admitting: Physician Assistant

## 2022-08-02 DIAGNOSIS — M79604 Pain in right leg: Secondary | ICD-10-CM | POA: Diagnosis not present

## 2022-08-02 DIAGNOSIS — M79605 Pain in left leg: Secondary | ICD-10-CM | POA: Diagnosis not present

## 2022-08-09 NOTE — Progress Notes (Signed)
I, Peterson Lombard, LAT, ATC acting as a scribe for Lynne Leader, MD.  Subjective:    CC: Bilat leg pain  HPI: Pt is a 51 y/o female c/o bilat leg pain x a couple of years, significantly worsening over the last 6 months. Pt did a 3.5 hour hike, at the end of August, and then could hardly move later that day. Pt notes that sh has a very stressful life. Pt has a hx of IBU and diverticulitis. Pt reports pain started as pelvic pain, then lower back. Pt locates pain to R-side of the low back/upper buttock, w/ radiating pain into bilat leg. Pt c/o varying joint pain, that will move from ankles, to shoulders, to knees randomly.   She recognized that her pain was out of proportion to her peers after a trip to [place] in [place] this summer when she was hiking with her friends and had much more pain than they did and was not able to keep up as she would normally expect.  Low back pain: yes Radiates: yes LE Numbness/tingling: no LE Weakness: no Aggravates: increased activity, worse at night,  Treatments tried: meloxicam  Dx testing: 07/27/22 Labs  04/17/22 Labs  Pertinent review of Systems: No fevers or chills  Relevant historical information: Mild intermittent asthma.  Irritable bowel syndrome.   Objective:    Vitals:   08/10/22 1310  BP: (!) 160/98  Pulse: 80  SpO2: 98%   General: Well Developed, well nourished, and in no acute distress.   MSK: C-spine: Normal appearing Normal cervical motion.  Right shoulder: Normal-appearing normal motion.  Nontender. Intact strength. Mildly positive impingement testing. Negative Yergason's and speeds test.   Left shoulder: Normal-appearing normal motion. Nontender. Intact strength. Mildly positive impingement testing. Negative Yergason's and speeds test.  Pulses and capillary fill are intact bilateral upper extremities.  L-spine: Nontender midline.  Tender palpation right lumbar paraspinal musculature.  Normal lumbar motion. Reflexes  and sensation are intact distally.  Right hip: Normal-appearing Mildly tender palpation greater trochanter. Normal hip motion. Hip abduction and external rotation strength are diminished 4/5.  Left hip: Normal appearing. Tender palpation mildly at the greater trochanter. Normal hip motion. Hip abduction and external rotation strength are diminished 4/5.  Lab and Radiology Results  X-ray images L-spine and right hip obtained today personally and independently interpreted  L-spine: Mild spondylosis L4-L5.  Mild facet DJD L4-L5.  No acute fractures.  Right hip: No significant DJD.  No acute fractures.  Await formal radiology review  Component     Latest Ref Rng 04/17/2022 07/27/2022  WBC     4.0 - 10.5 K/uL  10.3   RBC     3.87 - 5.11 Mil/uL  4.38   Hemoglobin     12.0 - 15.0 g/dL  13.0   HCT     36.0 - 46.0 %  38.5   MCV     78.0 - 100.0 fl  87.8   MCHC     30.0 - 36.0 g/dL  33.8   RDW     11.5 - 15.5 %  13.3   Platelets     150.0 - 400.0 K/uL  319.0   Neutrophils     43.0 - 77.0 %  54.0   Lymphocytes     12.0 - 46.0 %  33.4   Monocytes Relative     3.0 - 12.0 %  5.6   Eosinophil     0.0 - 5.0 %  6.0 (H)   Basophil  0.0 - 3.0 %  1.0   NEUT#     1.4 - 7.7 K/uL  5.6   Lymphocyte #     0.7 - 4.0 K/uL  3.4   Monocyte #     0.1 - 1.0 K/uL  0.6   Eosinophils Absolute     0.0 - 0.7 K/uL  0.6   Basophils Absolute     0.0 - 0.1 K/uL  0.1   Sodium     135 - 145 mEq/L  136   Potassium     3.5 - 5.1 mEq/L  4.1   Chloride     96 - 112 mEq/L  102   CO2     19 - 32 mEq/L  26   Glucose     70 - 99 mg/dL  90   BUN     6 - 23 mg/dL  16   Creatinine     0.40 - 1.20 mg/dL  0.78   Total Bilirubin     0.2 - 1.2 mg/dL  0.4   Alkaline Phosphatase     39 - 117 U/L  71   AST     0 - 37 U/L  19   ALT     0 - 35 U/L  18   Total Protein     6.0 - 8.3 g/dL  7.4   Albumin     3.5 - 5.2 g/dL  4.2   GFR     >60.00 mL/min  88.12   Calcium     8.4 - 10.5 mg/dL   9.5   Iron     42 - 145 ug/dL  81   Transferrin     212.0 - 360.0 mg/dL  264.0   Saturation Ratios     20.0 - 50.0 %  21.9   Ferritin     10.0 - 291.0 ng/mL  65.0   TIBC     250.0 - 450.0 mcg/dL  369.6   Hemoglobin A1C     4.6 - 6.5 % 5.4    Anti Nuclear Antibody (ANA)     NEGATIVE  NEGATIVE    RA Latex Turbid.     <14 IU/mL <14    Sed Rate     0 - 30 mm/hr 12    CRP     0.5 - 20.0 mg/dL <1.0    CK Total     7 - 177 U/L  47   TSH     0.35 - 5.50 uIU/mL  1.42   Vitamin B12     211 - 911 pg/mL  341     Legend: (H) High   Impression and Recommendations:    Assessment and Plan: 51 y.o. female with right low back pain and chronic bilateral lateral hip pain.  This has been ongoing for the last year and is worsening.  This occurs in the setting of general arthralgias especially bilateral shoulder pain.  She has had a good rheumatologic work-up already by her primary care provider which so far has been negative.  I think she has several different independent orthopedic issues all at once.  The lateral hip pain radiating down to the lateral thigh I believe is trochanteric bursitis or hip abductor tendinopathy.  She also has chronic right low back pain thought to be muscle spasm and dysfunction.  The upper extremity pain especially at night is thought to be predominantly shoulder impingement.  Could be some systemic underlying issue but this is less likely  especially given her negative rheumatologic work-up in May and August.  Plan for physical therapy trial and check back in 6 weeks.  If not improved would consider retrying rheumatologic work-up and advanced imaging such as lumbar spine MRI.Marland Kitchen  PDMP not reviewed this encounter. Orders Placed This Encounter  Procedures   DG Lumbar Spine 2-3 Views    Standing Status:   Future    Number of Occurrences:   1    Standing Expiration Date:   09/09/2022    Order Specific Question:   Reason for Exam (SYMPTOM  OR DIAGNOSIS  REQUIRED)    Answer:   low back pain    Order Specific Question:   Preferred imaging location?    Answer:   [facility]    Order Specific Question:   Is patient pregnant?    Answer:   No   DG HIP UNILAT W OR W/O PELVIS 2-3 VIEWS RIGHT    Standing Status:   Future    Number of Occurrences:   1    Standing Expiration Date:   09/09/2022    Order Specific Question:   Reason for Exam (SYMPTOM  OR DIAGNOSIS REQUIRED)    Answer:   right hip pain    Order Specific Question:   Preferred imaging location?    Answer:   [facility]    Order Specific Question:   Is patient pregnant?    Answer:   No   Ambulatory referral to Physical Therapy    Referral Priority:   Routine    Referral Type:   Physical Medicine    Referral Reason:   Specialty Services Required    Requested Specialty:   Physical Therapy    Number of Visits Requested:   1   No orders of the defined types were placed in this encounter.   Discussed warning signs or symptoms. Please see discharge instructions. Patient expresses understanding.   The above documentation has been reviewed and is accurate and complete Lynne Leader, M.D.

## 2022-08-10 ENCOUNTER — Ambulatory Visit: Payer: Commercial Managed Care - PPO | Admitting: Family Medicine

## 2022-08-10 ENCOUNTER — Ambulatory Visit (INDEPENDENT_AMBULATORY_CARE_PROVIDER_SITE_OTHER): Payer: Commercial Managed Care - PPO

## 2022-08-10 VITALS — BP 160/98 | HR 80 | Ht 59.5 in | Wt 140.0 lb

## 2022-08-10 DIAGNOSIS — M5441 Lumbago with sciatica, right side: Secondary | ICD-10-CM | POA: Diagnosis not present

## 2022-08-10 DIAGNOSIS — M5442 Lumbago with sciatica, left side: Secondary | ICD-10-CM | POA: Diagnosis not present

## 2022-08-10 DIAGNOSIS — G8929 Other chronic pain: Secondary | ICD-10-CM | POA: Diagnosis not present

## 2022-08-10 DIAGNOSIS — M7062 Trochanteric bursitis, left hip: Secondary | ICD-10-CM

## 2022-08-10 DIAGNOSIS — M25512 Pain in left shoulder: Secondary | ICD-10-CM

## 2022-08-10 DIAGNOSIS — M25511 Pain in right shoulder: Secondary | ICD-10-CM

## 2022-08-10 DIAGNOSIS — M7061 Trochanteric bursitis, right hip: Secondary | ICD-10-CM | POA: Diagnosis not present

## 2022-08-10 NOTE — Patient Instructions (Addendum)
Thank you for coming in today.   Please get an Xray today before you leave   I've referred you to Physical Therapy.  Let us know if you don't hear from them in one week.   Check back in 6 weeks 

## 2022-08-14 NOTE — Progress Notes (Signed)
Right hip shows mild arthritis changes.

## 2022-08-14 NOTE — Progress Notes (Signed)
Lumbar spine x-ray shows mild arthritis changes.

## 2022-08-15 NOTE — Therapy (Unsigned)
OUTPATIENT PHYSICAL THERAPY THORACOLUMBAR EVALUATION   Patient Name: [name] MRN: [id number] DOB:1971/03/08, 51 y.o., female Today's Date: 08/16/2022   PT End of Session - 08/16/22 0939     Visit Number 1    Number of Visits 16    Date for PT Re-Evaluation 10/11/22    Authorization Type UHC    PT Start Time [phone]    PT Stop Time 1016    PT Time Calculation (min) 37 min    Activity Tolerance Patient tolerated treatment well             Past Medical History:  Diagnosis Date   Anemia    Asthma    Cesarean delivery delivered    DIVERTICULITIS, HX OF 12/24/2007   DIVERTICULOSIS-COLON 07/21/2009   Hematuria, microscopic 10/26/2017   Chronic, since teens; never cystoscopy.   Irritable bowel syndrome 07/21/2009   Kidney stones    Migraine    Seasonal allergies    URINARY INCONTINENCE 06/15/2008   Past Surgical History:  Procedure Laterality Date   CESAREAN SECTION     COLONOSCOPY  04/19/2009   diverticulosis    G2 P2     1 c-section, 1 NSVD   LEAP  98   rectocele/cystocele repair  06/2009   right fibula fx     age 51   TOTAL VAGINAL HYSTERECTOMY  06/2009   has her ovaries, and SSF, sling   Patient Active Problem List   Diagnosis Date Noted   History of diverticulitis 05/13/2019   Hematuria, microscopic 10/26/2017   Mild intermittent asthma 01/29/2012   Allergic rhinitis 09/15/2011   Irritable bowel syndrome 07/21/2009    PCP: Inda Coke, PA  REFERRING PROVIDER: Gregor Hams, MD  REFERRING DIAG: M54.41,M54.42,G89.29 (ICD-10-CM) - Chronic right-sided low back pain with bilateral sciatica  Rationale for Evaluation and Treatment Rehabilitation  THERAPY DIAG:  Other low back pain  Muscle weakness (generalized)  ONSET DATE: Aug 1st. 2023  SUBJECTIVE:                                                                                                                                                                                           SUBJECTIVE  STATEMENT: States that she has had pelvic pain for a while especially with quick turns - feels a pull on the right side. States she goes to bed hurting in the front of her abdomen. States she has a lot of pressure. States that this is a constant pain especially at night. About 2 years ago she started feeling a lot of bilateral leg pain. States this pain has worsened and she is really tossing and  turning at night because of the leg pain. States she also has sciatica nerve also gets aggravated too. States she runs but she hasn't been running at all. States that when she goes running now it hurts and increases her pain afterwards.   States earlier this year she [place] and she was in so much pain she could barelt keep up or walk the next day. States she does work out 3x/week. States she has been walking more and her blood pressure has been higher.    PERTINENT HISTORY:  IBS, 17 years ago c-section, 12 years ago -hysterectomy (has ovaries), pelvic restoration with mesh  PAIN:  Are you having pain? Yes: NPRS scale: 0/10 Pain location: pelvis, back and legs  Pain description: stiffness, constant ache at night Aggravating factors: standing after sitting for long periods of time, at night Relieving factors: changing positions   PRECAUTIONS: None  WEIGHT BEARING RESTRICTIONS No  FALLS:  Has patient fallen in last 6 months? 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 No significant abnormality in the lumbar spine. 2. Mild degenerative changes at the right hip.    SCREENING FOR RED FLAGS: Bowel or bladder incontinence: No Spinal tumors: No Cauda equina syndrome: No Compression fracture: No Abdominal aneurysm: No  COGNITION:  Overall cognitive status: Within functional limits for tasks  assessed     SENSATION: WFL    POSTURE: rounded shoulders, lower rib flare   PALPATION: no significant tenderness noted in lumbar /hips/abdominals    LUMBAR ROM: assess next session  Active  A/PROM  eval  Flexion   Extension   Right lateral flexion   Left lateral flexion   Right rotation   Left rotation    (Blank rows = not tested)     LE Measurements Lower Extremity Right 08/16/2022 Left 08/16/2022   A/PROM MMT A/PROM MMT  Hip Flexion WFL 4 WFL 4  Hip Extension WFL 3 WFL 3  Hip Abduction  3  3  Hip Adduction  4  4  Hip Internal rotation [facility]  [facility]   Hip External rotation [facility]  WFL   Knee Flexion WFL 4 WFL 4  Knee Extension  5  5  Ankle Dorsiflexion      Ankle Plantarflexion      Ankle Inversion      Ankle Eversion       (Blank rows = not tested)  * pain  LUMBAR SPECIAL TESTS:  Neg slump, neg SLR      TODAY'S TREATMENT  08/16/2022 Therapeutic Exercise:  Aerobic: Supine: diaphragmatic breathing 5 minutes Prone:  Seated:  Standing: Neuromuscular Re-education: Manual Therapy: Therapeutic Activity: Self Care: Trigger Point Dry Needling:  Modalities:      PATIENT EDUCATION:  Education details: on current presentation, on HEP, on clinical outcomes score and POC, on reassurance and answered all concerns/questions, sleeping positions and support for back/hips Person educated: Patient Education method: Explanation, Demonstration, and Handouts Education comprehension: verbalized understanding  HOME EXERCISE PROGRAM: [email or web address]  ASSESSMENT:  CLINICAL IMPRESSION: Patient presents to physical therapy with chronic pelvic and low back pain and history of multiple pelvic/abdominal surgeries. Pain has progressed over the years and patient has primarily just dealt with it but can no longer participate in recreational activities or sleep at night because of the pain. Session focused on education  and on reassuring patient as she was very concerned that something  was wrong with her. Patient would greatly benefit from skilled PT to improve overall function and QOL.   OBJECTIVE IMPAIRMENTS decreased activity tolerance, decreased mobility, decreased ROM, decreased strength, postural dysfunction, and pain.   ACTIVITY LIMITATIONS sleeping and hiking  PARTICIPATION LIMITATIONS: community activity  PERSONAL FACTORS 3+ comorbidities: hysterectomy, pelvic reconstruction with mesh, IBS, cesarean section,   are also affecting patient's functional outcome.   REHAB POTENTIAL: Good  CLINICAL DECISION MAKING: Evolving/moderate complexity  EVALUATION COMPLEXITY: Moderate   GOALS: Goals reviewed with patient?  yes  SHORT TERM GOALS:  Patient will be independent in self management strategies to improve quality of life and functional outcomes. Baseline: new program Target date: 09/13/2022 Goal status: INITIAL  2.  Patient will report at least 25% improvement in overall symptoms and/or function to demonstrate improved functional mobility Baseline: 0% Target date: 09/13/2022 Goal status: INITIAL  3.  Patient will be ale to report obtaining comfortable position at night to improve sleep quality. Baseline: unable Target date: 09/13/2022 Goal status: INITIAL    LONG TERM GOALS:  Patient will report at least 75% improvement in overall symptoms and/or function to demonstrate improved functional mobility Baseline: 0% Target date: 10/11/2022 Goal status: INITIAL  2.  Patient will demonstrate at least 4-/5 MMT in LE Baseline: see above Target date: 10/11/2022 Goal status: INITIAL  3.  Patient will be able hike without severe pain during Baseline: unable Target date: 10/11/2022 Goal status: INITIAL        PLAN: PT FREQUENCY: 2x/week  PT DURATION: 8 weeks  PLANNED INTERVENTIONS: Therapeutic exercises, Therapeutic activity, Neuromuscular re-education, Balance training, Gait training, Patient/Family education, Self Care, Joint mobilization,  Vestibular training, Aquatic Therapy, Dry Needling, Cognitive remediation, Electrical stimulation, Spinal manipulation, Spinal mobilization, Cryotherapy, Moist heat, Ultrasound, Ionotophoresis '4mg'$ /ml Dexamethasone, and Manual therapy.  PLAN FOR NEXT SESSION: review proliefcs exercises/modify and make modifications as needed, assess leg length difference, sleeping positions, breath work, core activation   10:25 AM, 08/16/22 Jerene Pitch, DPT Physical Therapy with [facility]

## 2022-08-16 ENCOUNTER — Ambulatory Visit (INDEPENDENT_AMBULATORY_CARE_PROVIDER_SITE_OTHER): Payer: Commercial Managed Care - PPO | Admitting: Physical Therapy

## 2022-08-16 ENCOUNTER — Encounter: Payer: Self-pay | Admitting: Physical Therapy

## 2022-08-16 DIAGNOSIS — M6281 Muscle weakness (generalized): Secondary | ICD-10-CM

## 2022-08-16 DIAGNOSIS — M5459 Other low back pain: Secondary | ICD-10-CM | POA: Diagnosis not present

## 2022-08-22 NOTE — Therapy (Addendum)
OUTPATIENT PHYSICAL THERAPY TREATMENT NOTE   Patient Name: [name] MRN: [id number] DOB:07-28-1971, 51 y.o., female Today's Date: 08/23/2022  End of Session:      08/23/2022  PT Visits / Re-Eval  Visit Number 2  Number of Visits 16  Date for PT Re-Evaluation 10/11/22  Authorization  Authorization Type U[employer]  PT Time Calculation  PT Start Time 1434  PT Stop Time 1516  PT Time Calculation (min) 41 min  PT - End of Session  Activity Tolerance Patient tolerated treatment well     Past Medical History:  Diagnosis Date   Anemia     Asthma     Cesarean delivery delivered     DIVERTICULITIS, HX OF 12/24/2007   DIVERTICULOSIS-COLON 07/21/2009   Hematuria, microscopic 10/26/2017    Chronic, since teens; never cystoscopy.   Irritable bowel syndrome 07/21/2009   Kidney stones     Migraine     Seasonal allergies     URINARY INCONTINENCE 06/15/2008             Past Surgical History:  Procedure Laterality Date   CESAREAN SECTION       COLONOSCOPY   04/19/2009    diverticulosis    G2 P2        1 c-section, 1 NSVD   LEAP   98   rectocele/cystocele repair   06/2009   right fibula fx        age 22   TOTAL VAGINAL HYSTERECTOMY   06/2009    has her ovaries, and SSF, sling           Patient Active Problem List    Diagnosis Date Noted   History of diverticulitis 05/13/2019   Hematuria, microscopic 10/26/2017   Mild intermittent asthma 01/29/2012   Allergic rhinitis 09/15/2011   Irritable bowel syndrome 07/21/2009    PCP: Inda Coke, PA   REFERRING PROVIDER: Gregor Hams, MD   REFERRING DIAG: M54.41,M54.42,G89.29 (ICD-10-CM) - Chronic right-sided low back pain with bilateral sciatica   Rationale for Evaluation and Treatment Rehabilitation   THERAPY DIAG:  Other low back pain   Muscle weakness (generalized)   ONSET DATE: Aug 1st. 2023   SUBJECTIVE:                                                                                                                                                                                             SUBJECTIVE STATEMENT: 08/23/2022 States she is feeling alright and doesn't haven't a lot of current pain. States she has been doing her exercises. States she has been walking.   States that she has had pelvic  pain for a while especially with quick turns - feels a pull on the right side. States she goes to bed hurting in the front of her abdomen. States she has a lot of pressure. States that this is a constant pain especially at night. About 2 years ago she started feeling a lot of bilateral leg pain. States this pain has worsened and she is really tossing and turning at night because of the leg pain. States she also has sciatica nerve also gets aggravated too. States she runs but she hasn't been running at all. States that when she goes running now it hurts and increases her pain afterwards.    States earlier this year she [place] and she was in so much pain she could barelt keep up or walk the next day. States she does work out 3x/week. States she has been walking more and her blood pressure has been higher.      PERTINENT HISTORY:  IBS, 17 years ago c-section, 12 years ago -hysterectomy (has ovaries), pelvic restoration with mesh   PAIN:  Are you having pain? Yes: NPRS scale: 0/10 Pain location: pelvis, back and legs  Pain description: stiffness, constant ache at night Aggravating factors: standing after sitting for long periods of time, at night Relieving factors: changing positions     PRECAUTIONS: None   WEIGHT BEARING RESTRICTIONS No   FALLS:  Has patient fallen in last 6 months? 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 No significant abnormality in the lumbar spine. 2. Mild degenerative changes at the right hip.      SCREENING FOR RED FLAGS: Bowel or bladder incontinence: No Spinal tumors: No Cauda equina syndrome: No Compression fracture: No Abdominal aneurysm: No   COGNITION:           Overall cognitive status: Within functional limits for tasks assessed                          SENSATION: WFL     POSTURE: rounded shoulders, lower rib flare    PALPATION: no significant tenderness noted in lumbar /hips/abdominals     LUMBAR ROM: assess next session   Active  A/PROM  eval  Flexion    Extension    Right lateral flexion    Left lateral flexion    Right rotation    Left rotation     (Blank rows = not tested)                  LE Measurements       Lower Extremity Right 08/16/2022 Left 08/16/2022    A/PROM MMT A/PROM MMT  Hip Flexion WFL 4 WFL 4  Hip Extension WFL 3 WFL 3  Hip Abduction   3   3  Hip Adduction   4   4  Hip Internal rotation [facility]F   [facility]    Hip External rotation [facility]   [facility]    Knee Flexion WFL 4 WFL 4  Knee Extension   5   5  Ankle Dorsiflexion          Ankle Plantarflexion          Ankle Inversion          Ankle Eversion           (  Blank rows = not tested)            * pain   LUMBAR SPECIAL TESTS:  Neg slump, neg SLR           TODAY'S TREATMENT  08/23/2022  Therapeutic Exercise:    Aerobic: Supine: diaphragmatic breathing 5 minutes Prone:    Seated:    Standing: Neuromuscular Re-education: exhale - long breath out - upper abdominal activation lower rib depression.15 minutes, paired with lower abdominal activation and lower ab belly breathing 15 minutes  Manual Therapy: Therapeutic Activity: Self Care: Trigger Point Dry Needling:  Modalities:          PATIENT EDUCATION:  Education details: on anatomy, breathing mechanics on autonomic systems, on rationale for interventions Person educated: Patient Education method: Explanation, Demonstration, and Handouts Education comprehension: verbalized understanding   HOME EXERCISE  PROGRAM: [email or web address]   ASSESSMENT:   CLINICAL IMPRESSION: Session focused on education and breathing exercises. Tolerated well and able to progress to next stage of breathing exercises quickly. Educated patient on anatomy and rationale of exercises. No pain noted end of session. Will continue with current POC.     OBJECTIVE IMPAIRMENTS decreased activity tolerance, decreased mobility, decreased ROM, decreased strength, postural dysfunction, and pain.    ACTIVITY LIMITATIONS sleeping and hiking   PARTICIPATION LIMITATIONS: community activity   PERSONAL FACTORS 3+ comorbidities: hysterectomy, pelvic reconstruction with mesh, IBS, cesarean section,   are also affecting patient's functional outcome.    REHAB POTENTIAL: Good   CLINICAL DECISION MAKING: Evolving/moderate complexity   EVALUATION COMPLEXITY: Moderate     GOALS: Goals reviewed with patient?  yes   SHORT TERM GOALS:   Patient will be independent in self management strategies to improve quality of life and functional outcomes. Baseline: new program Target date: 09/13/2022 Goal status: INITIAL   2.  Patient will report at least 25% improvement in overall symptoms and/or function to demonstrate improved functional mobility Baseline: 0% Target date: 09/13/2022 Goal status: INITIAL   3.  Patient will be ale to report obtaining comfortable position at night to improve sleep quality. Baseline: unable Target date: 09/13/2022 Goal status: INITIAL       LONG TERM GOALS:   Patient will report at least 75% improvement in overall symptoms and/or function to demonstrate improved functional mobility Baseline: 0% Target date: 10/11/2022 Goal status: INITIAL   2.  Patient will demonstrate at least 4-/5 MMT in LE Baseline: see above Target date: 10/11/2022 Goal status: INITIAL   3.  Patient will be able hike without severe pain during Baseline: unable Target date: 10/11/2022 Goal status: INITIAL              PLAN: PT FREQUENCY: 2x/week   PT DURATION: 8 weeks   PLANNED INTERVENTIONS: Therapeutic exercises, Therapeutic activity, Neuromuscular re-education, Balance training, Gait training, Patient/Family education, Self Care, Joint mobilization, Vestibular training, Aquatic Therapy, Dry Needling, Cognitive remediation, Electrical stimulation, Spinal manipulation, Spinal mobilization, Cryotherapy, Moist heat, Ultrasound, Ionotophoresis '4mg'$ /ml Dexamethasone, and Manual therapy.   PLAN FOR NEXT SESSION:    assess leg length difference, sleeping positions, breath work, core activation     3:19 PM, 08/23/22 Jerene Pitch, DPT Physical Therapy with [place]

## 2022-08-23 ENCOUNTER — Encounter: Payer: Self-pay | Admitting: Physical Therapy

## 2022-08-23 ENCOUNTER — Ambulatory Visit: Payer: Commercial Managed Care - PPO | Admitting: Physical Therapy

## 2022-08-23 DIAGNOSIS — M6281 Muscle weakness (generalized): Secondary | ICD-10-CM

## 2022-08-23 DIAGNOSIS — M5459 Other low back pain: Secondary | ICD-10-CM | POA: Diagnosis not present

## 2022-08-29 ENCOUNTER — Ambulatory Visit (INDEPENDENT_AMBULATORY_CARE_PROVIDER_SITE_OTHER): Payer: Commercial Managed Care - PPO | Admitting: Physical Therapy

## 2022-08-29 ENCOUNTER — Encounter: Payer: Self-pay | Admitting: Physical Therapy

## 2022-08-29 DIAGNOSIS — M6281 Muscle weakness (generalized): Secondary | ICD-10-CM

## 2022-08-29 DIAGNOSIS — M5459 Other low back pain: Secondary | ICD-10-CM | POA: Diagnosis not present

## 2022-08-29 NOTE — Therapy (Addendum)
OUTPATIENT PHYSICAL THERAPY TREATMENT NOTE   Patient Name: [name] MRN: [id number] DOB:12/28/1970, 51 y.o., female Today's Date: 08/29/2022  End of Session:   08/29/22 1431  PT Visits / Re-Eval  Visit Number 3  Number of Visits 16  Date for PT Re-Evaluation 10/11/22  Authorization  Authorization Type [employer]  PT Time Calculation  PT Start Time 1431  PT Stop Time 1515  PT Time Calculation (min) 44 min  PT - End of Session  Activity Tolerance Patient tolerated treatment well    Past Medical History:  Diagnosis Date   Anemia     Asthma     Cesarean delivery delivered     DIVERTICULITIS, HX OF 12/24/2007   DIVERTICULOSIS-COLON 07/21/2009   Hematuria, microscopic 10/26/2017    Chronic, since teens; never cystoscopy.   Irritable bowel syndrome 07/21/2009   Kidney stones     Migraine     Seasonal allergies     URINARY INCONTINENCE 06/15/2008             Past Surgical History:  Procedure Laterality Date   CESAREAN SECTION       COLONOSCOPY   04/19/2009    diverticulosis    G2 P2        1 c-section, 1 NSVD   LEAP   89   rectocele/cystocele repair   06/2009   right fibula fx        age 77   TOTAL VAGINAL HYSTERECTOMY   06/2009    has her ovaries, and SSF, sling           Patient Active Problem List    Diagnosis Date Noted   History of diverticulitis 05/13/2019   Hematuria, microscopic 10/26/2017   Mild intermittent asthma 01/29/2012   Allergic rhinitis 09/15/2011   Irritable bowel syndrome 07/21/2009      PCP: Inda Coke, PA   REFERRING PROVIDER: Gregor Hams, MD   REFERRING DIAG: M54.41,M54.42,G89.29 (ICD-10-CM) - Chronic right-sided low back pain with bilateral sciatica   Rationale for Evaluation and Treatment Rehabilitation   THERAPY DIAG:  Other low back pain   Muscle weakness (generalized)   ONSET DATE: Aug 1st. 2023   SUBJECTIVE:                                                                                                                                                                                             SUBJECTIVE STATEMENT: 08/29/2022 States that she went to [employer] yesterday and she had a large purchase/heavy cart and she was there for 1.5 hours and she was in a lot of pain. States that her  knees were hurting really bad and she felt like her legs were tired and sore and achy and she felt like she needed to lay down and take a nap. States she was very tired and wasn't in pain. States she takes naps as she doesn't sleep well at night and her naps she takes deep sleep.States her pain at night is better. She is still uncomfortable but she is not shifting as much.  States that she has had pelvic pain for a while especially with quick turns - feels a pull on the right side. States she goes to bed hurting in the front of her abdomen. States she has a lot of pressure. States that this is a constant pain especially at night. About 2 years ago she started feeling a lot of bilateral leg pain. States this pain has worsened and she is really tossing and turning at night because of the leg pain. States she also has sciatica nerve also gets aggravated too. States she runs but she hasn't been running at all. States that when she goes running now it hurts and increases her pain afterwards.    States earlier this year she [place] and she was in so much pain she could barelt keep up or walk the next day. States she does work out 3x/week. States she has been walking more and her blood pressure has been higher.      PERTINENT HISTORY:  IBS, 17 years ago c-section, 12 years ago -hysterectomy (has ovaries), pelvic restoration with mesh   PAIN:  Are you having pain? Yes: NPRS scale: 0/10 Pain location: pelvis, back and legs  Pain description: stiffness, constant ache at night Aggravating factors: standing after sitting for long periods of time, at night Relieving factors: changing positions     PRECAUTIONS: None   WEIGHT  BEARING RESTRICTIONS No   FALLS:  Has patient fallen in last 6 months? 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 No significant abnormality in the lumbar spine. 2. Mild degenerative changes at the right hip.     SCREENING FOR RED FLAGS: Bowel or bladder incontinence: No Spinal tumors: No Cauda equina syndrome: No Compression fracture: No Abdominal aneurysm: No   COGNITION:           Overall cognitive status: Within functional limits for tasks assessed                          SENSATION: WFL     POSTURE: rounded shoulders, lower rib flare    PALPATION: no significant tenderness noted in lumbar /hips/abdominals     LUMBAR ROM: assess next session   Active  A/PROM  eval  Flexion    Extension    Right lateral flexion    Left lateral flexion    Right rotation    Left rotation     (Blank rows = not tested)                  LE Measurements       Lower Extremity Right 08/16/2022 Left 08/16/2022    A/PROM MMT A/PROM MMT  Hip Flexion WFL 4 WFL 4  Hip Extension WFL 3 WFL 3  Hip Abduction   3   3  Hip Adduction   4   4  Hip Internal rotation [facility]   [facility]    Hip External rotation [facility]   [facility]    Knee Flexion WFL 4 WFL 4  Knee Extension   5   5  Ankle Dorsiflexion          Ankle Plantarflexion          Ankle Inversion          Ankle Eversion           (Blank rows = not tested)            * pain   LUMBAR SPECIAL TESTS:  Neg slump, neg SLR           TODAY'S TREATMENT  08/29/2022  Therapeutic Exercise:    Aerobic: Supine:   Prone:    Seated:    Standing: Neuromuscular Re-education: exhale - long breath out - upper abdominal activation lower rib depression.10 minutes, box breathing 8 minutes Manual Therapy: Therapeutic Activity: Self Care: Trigger Point Dry Needling:   Modalities:          PATIENT EDUCATION:  Education details: on sleeping habits, bladder training, eye strategies and strategies to help with sleep, on shoes with standing on hard surfaces Person educated: Patient Education method: Explanation, Demonstration, and Handouts Education comprehension: verbalized understanding   HOME EXERCISE PROGRAM: [email or web address]   ASSESSMENT:   CLINICAL IMPRESSION: Session focused on education and on sleep strategies to improve deep sleep and ability to stay asleep. Talked about bladder training, positions, box breathing and eye movement strategies. Progressed/reviewed breathing exercises and added in focused inhale to lower abdomen. Tolerated this well. Will continue with current POC as tolerated.    OBJECTIVE IMPAIRMENTS decreased activity tolerance, decreased mobility, decreased ROM, decreased strength, postural dysfunction, and pain.    ACTIVITY LIMITATIONS sleeping and hiking   PARTICIPATION LIMITATIONS: community activity   PERSONAL FACTORS 3+ comorbidities: hysterectomy, pelvic reconstruction with mesh, IBS, cesarean section,   are also affecting patient's functional outcome.    REHAB POTENTIAL: Good   CLINICAL DECISION MAKING: Evolving/moderate complexity   EVALUATION COMPLEXITY: Moderate     GOALS: Goals reviewed with patient?  yes   SHORT TERM GOALS:   Patient will be independent in self management strategies to improve quality of life and functional outcomes. Baseline: new program Target date: 09/13/2022 Goal status: INITIAL   2.  Patient will report at least 25% improvement in overall symptoms and/or function to demonstrate improved functional mobility Baseline: 0% Target date: 09/13/2022 Goal status: INITIAL   3.  Patient will be ale to report obtaining comfortable position at night to improve sleep quality. Baseline: unable Target date: 09/13/2022 Goal status: INITIAL       LONG TERM GOALS:   Patient will report at  least 75% improvement in overall symptoms and/or function to demonstrate improved functional mobility Baseline: 0% Target date: 10/11/2022 Goal status: INITIAL   2.  Patient will demonstrate at least 4-/5 MMT in LE Baseline: see above Target date: 10/11/2022 Goal status: INITIAL   3.  Patient will be able hike without severe pain during Baseline: unable Target date: 10/11/2022 Goal status: INITIAL             PLAN: PT FREQUENCY: 2x/week   PT DURATION: 8 weeks   PLANNED INTERVENTIONS: Therapeutic exercises, Therapeutic activity, Neuromuscular re-education, Balance training, Gait training, Patient/Family education, Self Care, Joint mobilization, Vestibular training, Aquatic Therapy, Dry Needling, Cognitive remediation, Electrical stimulation, Spinal manipulation, Spinal mobilization, Cryotherapy,  Moist heat, Ultrasound, Ionotophoresis '4mg'$ /ml Dexamethasone, and Manual therapy.   PLAN FOR NEXT SESSION:    assess leg length difference, sleeping positions, breath work, core activation     3:38 PM, 08/29/22 Jerene Pitch, DPT Physical Therapy [facility]

## 2022-08-31 ENCOUNTER — Ambulatory Visit (INDEPENDENT_AMBULATORY_CARE_PROVIDER_SITE_OTHER): Payer: Commercial Managed Care - PPO | Admitting: Physical Therapy

## 2022-08-31 ENCOUNTER — Encounter: Payer: Self-pay | Admitting: Physical Therapy

## 2022-08-31 DIAGNOSIS — M6281 Muscle weakness (generalized): Secondary | ICD-10-CM | POA: Diagnosis not present

## 2022-08-31 DIAGNOSIS — M5459 Other low back pain: Secondary | ICD-10-CM | POA: Diagnosis not present

## 2022-08-31 NOTE — Therapy (Addendum)
OUTPATIENT PHYSICAL THERAPY TREATMENT NOTE   Patient Name: [name] MRN: [id number] DOB:1971-10-06, 51 y.o., female Today's Date: 08/31/2022   08/31/22 1432  PT Visits / Re-Eval  Visit Number 4  Number of Visits 16  Date for PT Re-Evaluation 10/11/22  Authorization  Authorization Type [employer]  PT Time Calculation  PT Start Time 1432  PT Stop Time 1515  PT Time Calculation (min) 43 min  PT - End of Session  Activity Tolerance Patient tolerated treatment well   Past Medical History:  Diagnosis Date   Anemia     Asthma     Cesarean delivery delivered     DIVERTICULITIS, HX OF 12/24/2007   DIVERTICULOSIS-COLON 07/21/2009   Hematuria, microscopic 10/26/2017    Chronic, since teens; never cystoscopy.   Irritable bowel syndrome 07/21/2009   Kidney stones     Migraine     Seasonal allergies     URINARY INCONTINENCE 06/15/2008             Past Surgical History:  Procedure Laterality Date   CESAREAN SECTION       COLONOSCOPY   04/19/2009    diverticulosis    G2 P2        1 c-section, 1 NSVD   LEAP   89   rectocele/cystocele repair   06/2009   right fibula fx        age 43   TOTAL VAGINAL HYSTERECTOMY   06/2009    has her ovaries, and SSF, sling           Patient Active Problem List    Diagnosis Date Noted   History of diverticulitis 05/13/2019   Hematuria, microscopic 10/26/2017   Mild intermittent asthma 01/29/2012   Allergic rhinitis 09/15/2011   Irritable bowel syndrome 07/21/2009    PCP: Inda Coke, PA   REFERRING PROVIDER: Gregor Hams, MD   REFERRING DIAG: M54.41,M54.42,G89.29 (ICD-10-CM) - Chronic right-sided low back pain with bilateral sciatica   Rationale for Evaluation and Treatment Rehabilitation   THERAPY DIAG:  Other low back pain   Muscle weakness (generalized)   ONSET DATE: Aug 1st. 2023   SUBJECTIVE:                                                                                                                                                                                             SUBJECTIVE STATEMENT: 08/31/2022 States that she is doing better and has been able to reduce times getting up at night due to needing to go to the bathroom. Her legs are not hurting as much except on long days. Would like to go  over the box breathing again today.  States that she has had pelvic pain for a while especially with quick turns - feels a pull on the right side. States she goes to bed hurting in the front of her abdomen. States she has a lot of pressure. States that this is a constant pain especially at night. About 2 years ago she started feeling a lot of bilateral leg pain. States this pain has worsened and she is really tossing and turning at night because of the leg pain. States she also has sciatica nerve also gets aggravated too. States she runs but she hasn't been running at all. States that when she goes running now it hurts and increases her pain afterwards.    States earlier this [place] she [place] and she was in so much pain she could barelt keep up or walk the next day. States she does work out 3x/week. States she has been walking more and her blood pressure has been higher.      PERTINENT HISTORY:  IBS, 17 years ago c-section, 12 years ago -hysterectomy (has ovaries), pelvic restoration with mesh   PAIN:  Are you having pain? Yes: NPRS scale: 0/10 Pain location: pelvis, back and legs  Pain description: stiffness, constant ache at night Aggravating factors: standing after sitting for long periods of time, at night Relieving factors: changing positions     PRECAUTIONS: None   WEIGHT BEARING RESTRICTIONS No   FALLS:  Has patient fallen in last 6 months? 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 No significant abnormality in the  lumbar spine. 2. Mild degenerative changes at the right hip.     SCREENING FOR RED FLAGS: Bowel or bladder incontinence: No Spinal tumors: No Cauda equina syndrome: No Compression fracture: No Abdominal aneurysm: No   COGNITION:           Overall cognitive status: Within functional limits for tasks assessed                          SENSATION: WFL     POSTURE: rounded shoulders, lower rib flare    PALPATION: no significant tenderness noted in lumbar /hips/abdominals     LUMBAR ROM: assess next session   Active  A/PROM  eval  Flexion    Extension    Right lateral flexion    Left lateral flexion    Right rotation    Left rotation     (Blank rows = not tested)                  LE Measurements       Lower Extremity Right 08/16/2022 Left 08/16/2022    A/PROM MMT A/PROM MMT  Hip Flexion WFL 4 WFL 4  Hip Extension WFL 3 WFL 3  Hip Abduction   3   3  Hip Adduction   4   4  Hip Internal rotation [facility]   [facility]    Hip External rotation [facility]   [facility]    Knee Flexion WFL 4 WFL 4  Knee Extension   5   5  Ankle Dorsiflexion          Ankle Plantarflexion  Ankle Inversion          Ankle Eversion           (Blank rows = not tested)            * pain   LUMBAR SPECIAL TESTS:  Neg slump, neg SLR           TODAY'S TREATMENT  08/31/2022  Therapeutic Exercise:    Aerobic: Supine:  bridge --> bridge with post tilt --> bridge with green theraband for hip abd - 12 minutes total - paired with exhale up and inhale down. Lumbar extension over towel roll with breathing 5 minutes, hip IR/ER fall ins and outs 5 minutes each/B Prone:    Seated:    Standing: Neuromuscular Re-education: box breathing with visual and verbal cues - 6 minutes, pelvic tilts with breathing and tactile cues- 6 minutes Manual Therapy: Therapeutic Activity: Self Care: Trigger Point Dry Needling:  Modalities:          PATIENT EDUCATION:  Education details: on sleeping habits, breathing and  stretches, review of HEP Person educated: Patient Education method: Explanation, Demonstration, and Handouts Education comprehension: verbalized understanding   HOME EXERCISE PROGRAM: [id number]   ASSESSMENT:   CLINICAL IMPRESSION: Added glute exercises and pelvic mobility, difficult for patient to isolate anterior pelvic tilt, this improved after lumbar extension stretch with towel roll and breathing. Reduced pressure noted in low back afterwards. Added all new exercises to HEP. Will continue with current POC as tolerated.    OBJECTIVE IMPAIRMENTS decreased activity tolerance, decreased mobility, decreased ROM, decreased strength, postural dysfunction, and pain.    ACTIVITY LIMITATIONS sleeping and hiking   PARTICIPATION LIMITATIONS: community activity   PERSONAL FACTORS 3+ comorbidities: hysterectomy, pelvic reconstruction with mesh, IBS, cesarean section,   are also affecting patient's functional outcome.    REHAB POTENTIAL: Good   CLINICAL DECISION MAKING: Evolving/moderate complexity   EVALUATION COMPLEXITY: Moderate     GOALS: Goals reviewed with patient?  yes   SHORT TERM GOALS:   Patient will be independent in self management strategies to improve quality of life and functional outcomes. Baseline: new program Target date: 09/13/2022 Goal status: INITIAL   2.  Patient will report at least 25% improvement in overall symptoms and/or function to demonstrate improved functional mobility Baseline: 0% Target date: 09/13/2022 Goal status: INITIAL   3.  Patient will be ale to report obtaining comfortable position at night to improve sleep quality. Baseline: unable Target date: 09/13/2022 Goal status: INITIAL       LONG TERM GOALS:   Patient will report at least 75% improvement in overall symptoms and/or function to demonstrate improved functional mobility Baseline: 0% Target date: 10/11/2022 Goal status: INITIAL   2.  Patient will demonstrate at least 4-/5 MMT in  LE Baseline: see above Target date: 10/11/2022 Goal status: INITIAL   3.  Patient will be able hike without severe pain during Baseline: unable Target date: 10/11/2022 Goal status: INITIAL             PLAN: PT FREQUENCY: 2x/week   PT DURATION: 8 weeks   PLANNED INTERVENTIONS: Therapeutic exercises, Therapeutic activity, Neuromuscular re-education, Balance training, Gait training, Patient/Family education, Self Care, Joint mobilization, Vestibular training, Aquatic Therapy, Dry Needling, Cognitive remediation, Electrical stimulation, Spinal manipulation, Spinal mobilization, Cryotherapy, Moist heat, Ultrasound, Ionotophoresis '4mg'$ /ml Dexamethasone, and Manual therapy.   PLAN FOR NEXT SESSION:    assess leg length difference, sleeping positions, breath work, core activation     3:22 PM, 08/31/22 Jerene Pitch,  DPT Physical Therapy with [facility]

## 2022-09-05 ENCOUNTER — Encounter: Payer: Self-pay | Admitting: Physical Therapy

## 2022-09-05 ENCOUNTER — Ambulatory Visit: Payer: Commercial Managed Care - PPO | Admitting: Physical Therapy

## 2022-09-05 DIAGNOSIS — M6281 Muscle weakness (generalized): Secondary | ICD-10-CM | POA: Diagnosis not present

## 2022-09-05 DIAGNOSIS — M5459 Other low back pain: Secondary | ICD-10-CM

## 2022-09-05 NOTE — Therapy (Addendum)
OUTPATIENT PHYSICAL THERAPY TREATMENT NOTE     Patient Name: [name] MRN: [id number] DOB:1971/01/13, 51 y.o., female Today's Date: 09/05/2022   END OF SESSION:    09/05/22 1437  PT Visits / Re-Eval  Visit Number 5  Number of Visits 16  Date for PT Re-Evaluation 10/11/22  Authorization  Authorization Type [employer]  PT Time Calculation  PT Start Time 1437 (late to checkin)  PT Stop Time 1517  PT Time Calculation (min) 40 min  PT - End of Session  Activity Tolerance Patient tolerated treatment well   Past Medical History:  Diagnosis Date   Anemia     Asthma     Cesarean delivery delivered     DIVERTICULITIS, HX OF 12/24/2007   DIVERTICULOSIS-COLON 07/21/2009   Hematuria, microscopic 10/26/2017    Chronic, since teens; never cystoscopy.   Irritable bowel syndrome 07/21/2009   Kidney stones     Migraine     Seasonal allergies     URINARY INCONTINENCE 06/15/2008         Past Surgical History:  Procedure Laterality Date   CESAREAN SECTION       COLONOSCOPY   04/19/2009    diverticulosis    G2 P2        1 c-section, 1 NSVD   LEAP   89   rectocele/cystocele repair   06/2009   right fibula fx        age 69   TOTAL VAGINAL HYSTERECTOMY   06/2009    has her ovaries, and SSF, sling        Patient Active Problem List    Diagnosis Date Noted   History of diverticulitis 05/13/2019   Hematuria, microscopic 10/26/2017   Mild intermittent asthma 01/29/2012   Allergic rhinitis 09/15/2011   Irritable bowel syndrome 07/21/2009     PCP: Inda Coke, PA   REFERRING PROVIDER: Gregor Hams, MD   REFERRING DIAG: M54.41,M54.42,G89.29 (ICD-10-CM) - Chronic right-sided low back pain with bilateral sciatica   Rationale for Evaluation and Treatment Rehabilitation   THERAPY DIAG:  Other low back pain   Muscle weakness (generalized)   ONSET DATE: Aug 1st. 2023   SUBJECTIVE:                                                                                                                                                                                             SUBJECTIVE STATEMENT: 09/05/2022 States that she has been busy and hasn't done as many things for her homework as she would like to. Still has pressure in abdomen on long days but overall not in as much pain  as she was.   States that she has had pelvic pain for a while especially with quick turns - feels a pull on the right side. States she goes to bed hurting in the front of her abdomen. States she has a lot of pressure. States that this is a constant pain especially at night. About 2 years ago she started feeling a lot of bilateral leg pain. States this pain has worsened and she is really tossing and turning at night because of the leg pain. States she also has sciatica nerve also gets aggravated too. States she runs but she hasn't been running at all. States that when she goes running now it hurts and increases her pain afterwards.    States earlier this [place] and she was in so much pain she could barelt keep up or walk the next day. States she does work out 3x/week. States she has been walking more and her blood pressure has been higher.      PERTINENT HISTORY:  IBS, 17 years ago c-section, 12 years ago -hysterectomy (has ovaries), pelvic restoration with mesh   PAIN:  Are you having pain? Yes: NPRS scale: 0/10 Pain location: pelvis, back and legs  Pain description: stiffness, constant ache at night Aggravating factors: standing after sitting for long periods of time, at night Relieving factors: changing positions     PRECAUTIONS: None   WEIGHT BEARING RESTRICTIONS No   FALLS:  Has patient fallen in last 6 months? 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 No significant abnormality in the  lumbar spine. 2. Mild degenerative changes at the right hip.     SCREENING FOR RED FLAGS: Bowel or bladder incontinence: No Spinal tumors: No Cauda equina syndrome: No Compression fracture: No Abdominal aneurysm: No   COGNITION:           Overall cognitive status: Within functional limits for tasks assessed                          SENSATION: WFL     POSTURE: rounded shoulders, lower rib flare    PALPATION: no significant tenderness noted in lumbar /hips/abdominals     LUMBAR ROM: assess next session   Active  A/PROM  eval  Flexion    Extension    Right lateral flexion    Left lateral flexion    Right rotation    Left rotation     (Blank rows = not tested)                  LE Measurements       Lower Extremity Right 08/16/2022 Left 08/16/2022    A/PROM MMT A/PROM MMT  Hip Flexion WFL 4 WFL 4  Hip Extension WFL 3 WFL 3  Hip Abduction   3   3  Hip Adduction   4   4  Hip Internal rotation [facility]   [address] [facility]    Hip External rotation [facility]   [facility]    Knee Flexion WFL 4 WFL 4  Knee Extension   5   5  Ankle Dorsiflexion          Ankle Plantarflexion          Ankle Inversion  Ankle Eversion           (Blank rows = not tested)            * pain   LUMBAR SPECIAL TESTS:  Neg slump, neg SLR           TODAY'S TREATMENT  09/05/2022  Therapeutic Exercise:    Aerobic: Supine:  Lumbar extension over towel roll with breathing 5 minutes, bridges x30 , reviewed HEP Prone:    Seated:    Standing: Neuromuscular Re-education: physiological sigh - 5 minutes, long exhale breathing 5 minutes Manual Therapy: Therapeutic Activity: Self Care: Trigger Point Dry Needling:  Modalities:          PATIENT EDUCATION:  Education details: on sleeping habits, on SIJ belt - benefits and use of compression stockings to achieve this, about sleep quality and wearing watch to track at night  Person educated: Patient Education method: Explanation, Demonstration, and  Handouts Education comprehension: verbalized understanding   HOME EXERCISE PROGRAM: [id number]   ASSESSMENT:   CLINICAL IMPRESSION: Session focused on education and review of HEP. Reassured patient and discussed additional strategies to help with unsteadiness while standing and stepping over items/up and down bleachers. Trailed mock SIJ belt and patient reported improved stability. Discussed added compression garments or getting belt to see how that feels with in regards to her stability. Added new breathing technique which was tolerated well. Will continue with current POC as tolerated.    OBJECTIVE IMPAIRMENTS decreased activity tolerance, decreased mobility, decreased ROM, decreased strength, postural dysfunction, and pain.    ACTIVITY LIMITATIONS sleeping and hiking   PARTICIPATION LIMITATIONS: community activity   PERSONAL FACTORS 3+ comorbidities: hysterectomy, pelvic reconstruction with mesh, IBS, cesarean section,   are also affecting patient's functional outcome.    REHAB POTENTIAL: Good   CLINICAL DECISION MAKING: Evolving/moderate complexity   EVALUATION COMPLEXITY: Moderate     GOALS: Goals reviewed with patient?  yes   SHORT TERM GOALS:   Patient will be independent in self management strategies to improve quality of life and functional outcomes. Baseline: new program Target date: 09/13/2022 Goal status: INITIAL   2.  Patient will report at least 25% improvement in overall symptoms and/or function to demonstrate improved functional mobility Baseline: 0% Target date: 09/13/2022 Goal status: INITIAL   3.  Patient will be ale to report obtaining comfortable position at night to improve sleep quality. Baseline: unable Target date: 09/13/2022 Goal status: INITIAL       LONG TERM GOALS:   Patient will report at least 75% improvement in overall symptoms and/or function to demonstrate improved functional mobility Baseline: 0% Target date: 10/11/2022 Goal  status: INITIAL   2.  Patient will demonstrate at least 4-/5 MMT in LE Baseline: see above Target date: 10/11/2022 Goal status: INITIAL   3.  Patient will be able hike without severe pain during Baseline: unable Target date: 10/11/2022 Goal status: INITIAL             PLAN: PT FREQUENCY: 2x/week   PT DURATION: 8 weeks   PLANNED INTERVENTIONS: Therapeutic exercises, Therapeutic activity, Neuromuscular re-education, Balance training, Gait training, Patient/Family education, Self Care, Joint mobilization, Vestibular training, Aquatic Therapy, Dry Needling, Cognitive remediation, Electrical stimulation, Spinal manipulation, Spinal mobilization, Cryotherapy, Moist heat, Ultrasound, Ionotophoresis '4mg'$ /ml Dexamethasone, and Manual therapy.   PLAN FOR NEXT SESSION: f/u with 5 minute morning routine, SIJ assessment.   assess leg length difference, sleeping positions, breath work, core activation     4:38 PM, 09/05/22 Jerene Pitch, DPT  Physical Therapy with [place]

## 2022-09-07 ENCOUNTER — Encounter: Payer: Self-pay | Admitting: Physical Therapy

## 2022-09-07 ENCOUNTER — Ambulatory Visit (INDEPENDENT_AMBULATORY_CARE_PROVIDER_SITE_OTHER): Payer: Commercial Managed Care - PPO | Admitting: Physical Therapy

## 2022-09-07 DIAGNOSIS — M5459 Other low back pain: Secondary | ICD-10-CM | POA: Diagnosis not present

## 2022-09-07 DIAGNOSIS — M6281 Muscle weakness (generalized): Secondary | ICD-10-CM | POA: Diagnosis not present

## 2022-09-07 NOTE — Therapy (Addendum)
OUTPATIENT PHYSICAL THERAPY TREATMENT NOTE   Patient Name: [name] MRN: [id number] DOB:[birth year], 51 y.o., female Today's Date: 09/07/2022    END OF SESSION:    09/07/22 1432  PT Visits / Re-Eval  Visit Number 6  Number of Visits 16  Date for PT Re-Evaluation 10/11/22  Authorization  Authorization Type [employer]  PT Time Calculation  PT Start Time 1433  PT Stop Time 1511  PT Time Calculation (min) 38 min  PT - End of Session  Activity Tolerance Patient tolerated treatment well    Past Medical History:  Diagnosis Date   Anemia    Asthma    Cesarean delivery delivered    DIVERTICULITIS, HX OF 12/24/2007   DIVERTICULOSIS-COLON 07/21/2009   Hematuria, microscopic 10/26/2017   Chronic, since teens; never cystoscopy.   Irritable bowel syndrome 07/21/2009   Kidney stones    Migraine    Seasonal allergies    URINARY INCONTINENCE 06/15/2008   Past Surgical History:  Procedure Laterality Date   CESAREAN SECTION     COLONOSCOPY  04/19/2009   diverticulosis    G2 P2     1 c-section, 1 NSVD   LEAP  89   rectocele/cystocele repair  06/2009   right fibula fx     age 44   TOTAL VAGINAL HYSTERECTOMY  06/2009   has her ovaries, and SSF, sling   Patient Active Problem List   Diagnosis Date Noted   History of diverticulitis 05/13/2019   Hematuria, microscopic 10/26/2017   Mild intermittent asthma 01/29/2012   Allergic rhinitis 09/15/2011   Irritable bowel syndrome 07/21/2009   PCP: Inda Coke, PA   REFERRING PROVIDER: Gregor Hams, MD   REFERRING DIAG: M54.41,M54.42,G89.29 (ICD-10-CM) - Chronic right-sided low back pain with bilateral sciatica   Rationale for Evaluation and Treatment Rehabilitation   THERAPY DIAG:  Other low back pain   Muscle weakness (generalized)   ONSET DATE: Aug 1st. 2023   SUBJECTIVE:                                                                                                                                                                                             SUBJECTIVE STATEMENT: 09/07/2022 States overall she is feeling good with minimal soreness and pain.  States that she has had pelvic pain for a while especially with quick turns - feels a pull on the right side. States she goes to bed hurting in the front of her abdomen. States she has a lot of pressure. States that this is a constant pain especially at night. About 2 years ago she started feeling a lot of bilateral  leg pain. States this pain has worsened and she is really tossing and turning at night because of the leg pain. States she also has sciatica nerve also gets aggravated too. States she runs but she hasn't been running at all. States that when she goes running now it hurts and increases her pain afterwards.    States earlier this [place] she [place] and she was in so much pain she could barelt keep up or walk the next day. States she does work out 3x/week. States she has been walking more and her blood pressure has been higher.      PERTINENT HISTORY:  IBS, 17 years ago c-section, 12 years ago -hysterectomy (has ovaries), pelvic restoration with mesh   PAIN:  Are you having pain? Yes: NPRS scale: 0/10 Pain location: pelvis, back and legs  Pain description: stiffness, constant ache at night Aggravating factors: standing after sitting for long periods of time, at night Relieving factors: changing positions     PRECAUTIONS: None   WEIGHT BEARING RESTRICTIONS No   FALLS:  Has patient fallen in last 6 months? 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 No significant abnormality in the lumbar spine. 2. Mild degenerative changes at the right hip.     SCREENING FOR RED FLAGS: Bowel or bladder incontinence: No Spinal tumors: No Cauda equina syndrome:  No Compression fracture: No Abdominal aneurysm: No   COGNITION:           Overall cognitive status: Within functional limits for tasks assessed                          SENSATION: WFL     POSTURE: rounded shoulders, lower rib flare    PALPATION: no significant tenderness noted in lumbar /hips/abdominals     LUMBAR ROM: assess next session   Active  A/PROM  eval  Flexion    Extension    Right lateral flexion    Left lateral flexion    Right rotation    Left rotation     (Blank rows = not tested)                  LE Measurements       Lower Extremity Right 08/16/2022 Left 08/16/2022    A/PROM MMT A/PROM MMT  Hip Flexion WFL 4 WFL 4  Hip Extension WFL 3 WFL 3  Hip Abduction   3   3  Hip Adduction   4   4  Hip Internal rotation [facility]   [facility]    Hip External rotation [facility]   [facility]    Knee Flexion WFL 4 WFL 4  Knee Extension   5   5  Ankle Dorsiflexion          Ankle Plantarflexion          Ankle Inversion          Ankle Eversion           (Blank rows = not tested)            * pain   LUMBAR SPECIAL TESTS:  Neg slump, neg SLR           TODAY'S TREATMENT  09/07/2022  Therapeutic Exercise:  Aerobic: Supine:  Lumbar extension over towel roll with breathing 5 minutes, bridges x30 , reviewed HEP Prone:    Seated:    Standing: quad pelvic tilts 5 minutes - tactile cues, hip abd/add iso seated and supine: and hip scissor iso 10 minutes total  Neuromuscular Re-education:   Manual Therapy: IASTM and STM to lumbar paraspinals - tolerated well  Therapeutic Activity: Self Care: Trigger Point Dry Needling:  Modalities:          PATIENT EDUCATION:  Education details: on HEP and anatomy Person educated: Patient Education method: Explanation, Demonstration, and Handouts Education comprehension: verbalized understanding   HOME EXERCISE PROGRAM: [email or web address]   ASSESSMENT:   CLINICAL IMPRESSION: Assessed leg length and functional leg length noted with left  shorter. Performed isometrics and educated patient on anatomy of joint, reduced pain noted afterwards and functional leg length reduced. Difficult for patient to achieve anterior pelvic til, educated on this and will continue to work on pelvic mobility and strengthening as tolerated.    OBJECTIVE IMPAIRMENTS decreased activity tolerance, decreased mobility, decreased ROM, decreased strength, postural dysfunction, and pain.    ACTIVITY LIMITATIONS sleeping and hiking   PARTICIPATION LIMITATIONS: community activity   PERSONAL FACTORS 3+ comorbidities: hysterectomy, pelvic reconstruction with mesh, IBS, cesarean section,   are also affecting patient's functional outcome.    REHAB POTENTIAL: Good   CLINICAL DECISION MAKING: Evolving/moderate complexity   EVALUATION COMPLEXITY: Moderate     GOALS: Goals reviewed with patient?  yes   SHORT TERM GOALS:   Patient will be independent in self management strategies to improve quality of life and functional outcomes. Baseline: new program Target date: 09/13/2022 Goal status: INITIAL   2.  Patient will report at least 25% improvement in overall symptoms and/or function to demonstrate improved functional mobility Baseline: 0% Target date: 09/13/2022 Goal status: INITIAL   3.  Patient will be ale to report obtaining comfortable position at night to improve sleep quality. Baseline: unable Target date: 09/13/2022 Goal status: INITIAL       LONG TERM GOALS:   Patient will report at least 75% improvement in overall symptoms and/or function to demonstrate improved functional mobility Baseline: 0% Target date: 10/11/2022 Goal status: INITIAL   2.  Patient will demonstrate at least 4-/5 MMT in LE Baseline: see above Target date: 10/11/2022 Goal status: INITIAL   3.  Patient will be able hike without severe pain during Baseline: unable Target date: 10/11/2022 Goal status: INITIAL             PLAN: PT FREQUENCY: 2x/week   PT  DURATION: 8 weeks   PLANNED INTERVENTIONS: Therapeutic exercises, Therapeutic activity, Neuromuscular re-education, Balance training, Gait training, Patient/Family education, Self Care, Joint mobilization, Vestibular training, Aquatic Therapy, Dry Needling, Cognitive remediation, Electrical stimulation, Spinal manipulation, Spinal mobilization, Cryotherapy, Moist heat, Ultrasound, Ionotophoresis '4mg'$ /ml Dexamethasone, and Manual therapy.   PLAN FOR NEXT SESSION: f/u with 5 minute morning routine, SIJ assessment.   assess leg length difference, sleeping positions, breath work, core activation     9:13 AM, 09/10/22 Jerene Pitch, DPT Physical Therapy with [place]

## 2022-09-10 NOTE — Progress Notes (Signed)
   09/07/22 1432  PT Visits / Re-Eval  Visit Number 6  Number of Visits 16  Date for PT Re-Evaluation 10/11/22  Authorization  Authorization Type [employer]  PT Time Calculation  PT Start Time 2355  PT Stop Time 1511  PT Time Calculation (min) 38 min  PT - End of Session  Activity Tolerance Patient tolerated treatment well

## 2022-09-11 ENCOUNTER — Ambulatory Visit (INDEPENDENT_AMBULATORY_CARE_PROVIDER_SITE_OTHER): Payer: Commercial Managed Care - PPO | Admitting: Physical Therapy

## 2022-09-11 ENCOUNTER — Encounter: Payer: Self-pay | Admitting: Physical Therapy

## 2022-09-11 DIAGNOSIS — M6281 Muscle weakness (generalized): Secondary | ICD-10-CM

## 2022-09-11 DIAGNOSIS — M5459 Other low back pain: Secondary | ICD-10-CM

## 2022-09-11 NOTE — Therapy (Signed)
OUTPATIENT PHYSICAL THERAPY TREATMENT NOTE   Patient Name: [name] MRN: [id number] DOB:December 24, 1970, 51 y.o., female Today's Date: 09/07/2022    END OF SESSION:   PT End of Session - 09/11/22 1430     Visit Number 7    Number of Visits 16    Date for PT Re-Evaluation 10/11/22    Authorization Type [employer]    PT Start Time 9767    PT Stop Time 1514    PT Time Calculation (min) 43 min    Activity Tolerance Patient tolerated treatment well               Past Medical History:  Diagnosis Date   Anemia    Asthma    Cesarean delivery delivered    DIVERTICULITIS, HX OF 12/24/2007   DIVERTICULOSIS-COLON 07/21/2009   Hematuria, microscopic 10/26/2017   Chronic, since teens; never cystoscopy.   Irritable bowel syndrome 07/21/2009   Kidney stones    Migraine    Seasonal allergies    URINARY INCONTINENCE 06/15/2008   Past Surgical History:  Procedure Laterality Date   CESAREAN SECTION     COLONOSCOPY  04/19/2009   diverticulosis    G2 P2     1 c-section, 1 NSVD   LEAP  98   rectocele/cystocele repair  06/2009   right fibula fx     age 58   TOTAL VAGINAL HYSTERECTOMY  06/2009   has her ovaries, and SSF, sling   Patient Active Problem List   Diagnosis Date Noted   History of diverticulitis 05/13/2019   Hematuria, microscopic 10/26/2017   Mild intermittent asthma 01/29/2012   Allergic rhinitis 09/15/2011   Irritable bowel syndrome 07/21/2009   PCP: Inda Coke, PA   REFERRING PROVIDER: Gregor Hams, MD   REFERRING DIAG: M54.41,M54.42,G89.29 (ICD-10-CM) - Chronic right-sided low back pain with bilateral sciatica   Rationale for Evaluation and Treatment Rehabilitation   THERAPY DIAG:  Other low back pain   Muscle weakness (generalized)   ONSET DATE: Aug 1st. 2023   SUBJECTIVE:                                                                                                                                                                                             SUBJECTIVE STATEMENT: 09/11/2022 States that she had a big weekend and still feels it at night. States that she is still having a lot of pressure but she doesn't feel it as much in her legs.   States that she has had pelvic pain for a while especially with quick turns - feels a pull on the right side. States she  goes to bed hurting in the front of her abdomen. States she has a lot of pressure. States that this is a constant pain especially at night. About 2 years ago she started feeling a lot of bilateral leg pain. States this pain has worsened and she is really tossing and turning at night because of the leg pain. States she also has sciatica nerve also gets aggravated too. States she runs but she hasn't been running at all. States that when she goes running now it hurts and increases her pain afterwards.    States earlier this year [place] and she was in so much pain she could barelt keep up or walk the next day. States she does work out 3x/week. States she has been walking more and her blood pressure has been higher.      PERTINENT HISTORY:  IBS, 17 years ago c-section, 12 years ago -hysterectomy (has ovaries), pelvic restoration with mesh   PAIN:  Are you having pain? Yes: NPRS scale: 2/10 Pain location: pelvis, back and legs  Pain description: stiffness, constant ache at night Aggravating factors: standing after sitting for long periods of time, at night Relieving factors: changing positions     PRECAUTIONS: None   WEIGHT BEARING RESTRICTIONS No   FALLS:  Has patient fallen in last 6 months? 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 No significant abnormality in the lumbar spine. 2. Mild degenerative changes at the right hip.     SCREENING FOR RED FLAGS: Bowel or bladder  incontinence: No Spinal tumors: No Cauda equina syndrome: No Compression fracture: No Abdominal aneurysm: No   COGNITION:           Overall cognitive status: Within functional limits for tasks assessed                          SENSATION: WFL     POSTURE: rounded shoulders, lower rib flare    PALPATION: no significant tenderness noted in lumbar /hips/abdominals     LUMBAR ROM: assess next session   Active  A/PROM  eval  Flexion    Extension    Right lateral flexion    Left lateral flexion    Right rotation    Left rotation     (Blank rows = not tested)                  LE Measurements       Lower Extremity Right 08/16/2022 Left 08/16/2022    A/PROM MMT A/PROM MMT  Hip Flexion WFL 4 WFL 4  Hip Extension WFL 3 WFL 3  Hip Abduction   3   3  Hip Adduction   4   4  Hip Internal rotation [facility]   [facility]    Hip External rotation [facility]   [facility]    Knee Flexion WFL 4 WFL 4  Knee Extension   5   5  Ankle Dorsiflexion          Ankle Plantarflexion          Ankle Inversion          Ankle Eversion           (Blank rows = not tested)            *  pain   LUMBAR SPECIAL TESTS:  Neg slump, neg SLR           TODAY'S TREATMENT  09/11/2022 Therapeutic Exercise:    Aerobic: Supine:  Lumbar extension over towel roll with breathing 5 minutes, HEP, self abdominal massage 5 minutes Prone:    Seated:    Standing:  Neuromuscular Re-education:   Manual Therapy: IASTM and STM to lumbar paraspinals - tolerated well 18 minutes, abdominal massage to assist peristalsis - 10 minutes Therapeutic Activity: Self Care: Trigger Point Dry Needling:  Modalities:          PATIENT EDUCATION:  Education details: on HEP and anatomy Person educated: Patient Education method: Explanation, Demonstration, and Handouts Education comprehension: verbalized understanding   HOME EXERCISE PROGRAM: [id number]   ASSESSMENT:   CLINICAL IMPRESSION: Focused on manual interventions and review of HEP.  This was tolerated well and improved relaxation and reduced pain noted end of session. Answered all questions and educated patient in self mobilization techniques for continued improvements at home. Will continue with current POC as tolerated   OBJECTIVE IMPAIRMENTS decreased activity tolerance, decreased mobility, decreased ROM, decreased strength, postural dysfunction, and pain.    ACTIVITY LIMITATIONS sleeping and hiking   PARTICIPATION LIMITATIONS: community activity   PERSONAL FACTORS 3+ comorbidities: hysterectomy, pelvic reconstruction with mesh, IBS, cesarean section,   are also affecting patient's functional outcome.    REHAB POTENTIAL: Good   CLINICAL DECISION MAKING: Evolving/moderate complexity   EVALUATION COMPLEXITY: Moderate     GOALS: Goals reviewed with patient?  yes   SHORT TERM GOALS:   Patient will be independent in self management strategies to improve quality of life and functional outcomes. Baseline: new program Target date: 09/13/2022 Goal status: INITIAL   2.  Patient will report at least 25% improvement in overall symptoms and/or function to demonstrate improved functional mobility Baseline: 0% Target date: 09/13/2022 Goal status: INITIAL   3.  Patient will be ale to report obtaining comfortable position at night to improve sleep quality. Baseline: unable Target date: 09/13/2022 Goal status: INITIAL       LONG TERM GOALS:   Patient will report at least 75% improvement in overall symptoms and/or function to demonstrate improved functional mobility Baseline: 0% Target date: 10/11/2022 Goal status: INITIAL   2.  Patient will demonstrate at least 4-/5 MMT in LE Baseline: see above Target date: 10/11/2022 Goal status: INITIAL   3.  Patient will be able hike without severe pain during Baseline: unable Target date: 10/11/2022 Goal status: INITIAL             PLAN: PT FREQUENCY: 2x/week   PT DURATION: 8 weeks   PLANNED INTERVENTIONS:  Therapeutic exercises, Therapeutic activity, Neuromuscular re-education, Balance training, Gait training, Patient/Family education, Self Care, Joint mobilization, Vestibular training, Aquatic Therapy, Dry Needling, Cognitive remediation, Electrical stimulation, Spinal manipulation, Spinal mobilization, Cryotherapy, Moist heat, Ultrasound, Ionotophoresis '4mg'$ /ml Dexamethasone, and Manual therapy.   PLAN FOR NEXT SESSION: f/u with 5 minute morning routine, SIJ assessment.   assess leg length difference, sleeping positions, breath work, core activation     4:10 PM, 09/11/22 Jerene Pitch, DPT Physical Therapy with

## 2022-09-13 ENCOUNTER — Ambulatory Visit (INDEPENDENT_AMBULATORY_CARE_PROVIDER_SITE_OTHER): Payer: Commercial Managed Care - PPO | Admitting: Physical Therapy

## 2022-09-13 ENCOUNTER — Encounter: Payer: Self-pay | Admitting: Physical Therapy

## 2022-09-13 DIAGNOSIS — M5459 Other low back pain: Secondary | ICD-10-CM

## 2022-09-13 DIAGNOSIS — M6281 Muscle weakness (generalized): Secondary | ICD-10-CM

## 2022-09-13 NOTE — Therapy (Signed)
OUTPATIENT PHYSICAL THERAPY TREATMENT NOTE   Patient Name: [name] MRN: [id number] DOB:[birth year], 51 y.o., female Today's Date: 09/07/2022    END OF SESSION:   PT End of Session - 09/13/22 1430     Visit Number 8    Number of Visits 16    Date for PT Re-Evaluation 10/11/22    Authorization Type [employer]    PT Start Time 5102    PT Stop Time 1526    PT Time Calculation (min) 55 min    Activity Tolerance Patient tolerated treatment well               Past Medical History:  Diagnosis Date   Anemia    Asthma    Cesarean delivery delivered    DIVERTICULITIS, HX OF 12/24/2007   DIVERTICULOSIS-COLON 07/21/2009   Hematuria, microscopic 10/26/2017   Chronic, since teens; never cystoscopy.   Irritable bowel syndrome 07/21/2009   Kidney stones    Migraine    Seasonal allergies    URINARY INCONTINENCE 06/15/2008   Past Surgical History:  Procedure Laterality Date   CESAREAN SECTION     COLONOSCOPY  04/19/2009   diverticulosis    G2 P2     1 c-section, 1 NSVD   LEAP  98   rectocele/cystocele repair  06/2009   right fibula fx     age 64   TOTAL VAGINAL HYSTERECTOMY  06/2009   has her ovaries, and SSF, sling   Patient Active Problem List   Diagnosis Date Noted   History of diverticulitis 05/13/2019   Hematuria, microscopic 10/26/2017   Mild intermittent asthma 01/29/2012   Allergic rhinitis 09/15/2011   Irritable bowel syndrome 07/21/2009   PCP: Inda Coke, PA   REFERRING PROVIDER: Gregor Hams, MD   REFERRING DIAG: M54.41,M54.42,G89.29 (ICD-10-CM) - Chronic right-sided low back pain with bilateral sciatica   Rationale for Evaluation and Treatment Rehabilitation   THERAPY DIAG:  Other low back pain   Muscle weakness (generalized)   ONSET DATE: Aug 1st. 2023   SUBJECTIVE:                                                                                                                                                                                             SUBJECTIVE STATEMENT: 09/13/2022 States that she felt great after last session. States that the pressure in her abdomen was not there. States she still feeling an ache in her legs   States that she has had pelvic pain for a while especially with quick turns - feels a pull on the right side. States she goes to bed hurting in the  front of her abdomen. States she has a lot of pressure. States that this is a constant pain especially at night. About 2 years ago she started feeling a lot of bilateral leg pain. States this pain has worsened and she is really tossing and turning at night because of the leg pain. States she also has sciatica nerve also gets aggravated too. States she runs but she hasn't been running at all. States that when she goes running now it hurts and increases her pain afterwards.    States earlier this [place] and she was in so much pain she could barelt keep up or walk the next day. States she does work out 3x/week. States she has been walking more and her blood pressure has been higher.      PERTINENT HISTORY:  IBS, 17 years ago c-section, 12 years ago -hysterectomy (has ovaries), pelvic restoration with mesh   PAIN:  Are you having pain? Yes: NPRS scale: 2/10 Pain location: pelvis, back and legs  Pain description: stiffness, constant ache at night Aggravating factors: standing after sitting for long periods of time, at night Relieving factors: changing positions     PRECAUTIONS: None   WEIGHT BEARING RESTRICTIONS No   FALLS:  Has patient fallen in last 6 months? 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 No significant abnormality in the lumbar spine. 2. Mild degenerative changes at the right hip.     SCREENING FOR RED FLAGS: Bowel or bladder incontinence:  No Spinal tumors: No Cauda equina syndrome: No Compression fracture: No Abdominal aneurysm: No   COGNITION:           Overall cognitive status: Within functional limits for tasks assessed                          SENSATION: WFL     POSTURE: rounded shoulders, lower rib flare    PALPATION: no significant tenderness noted in lumbar /hips/abdominals     LUMBAR ROM: assess next session   Active  A/PROM  eval  Flexion    Extension    Right lateral flexion    Left lateral flexion    Right rotation    Left rotation     (Blank rows = not tested)                  LE Measurements       Lower Extremity Right 08/16/2022 Left 08/16/2022    A/PROM MMT A/PROM MMT  Hip Flexion WFL 4 WFL 4  Hip Extension WFL 3 WFL 3  Hip Abduction   3   3  Hip Adduction   4   4  Hip Internal rotation [facility]   [facility]    Hip External rotation [facility]   [facility]    Knee Flexion WFL 4 WFL 4  Knee Extension   5   5  Ankle Dorsiflexion          Ankle Plantarflexion          Ankle Inversion          Ankle Eversion           (Blank rows = not tested)            *  pain   LUMBAR SPECIAL TESTS:  Neg slump, neg SLR           TODAY'S TREATMENT  09/13/2022 Therapeutic Exercise:    Aerobic: Supine:  Review of HEP Prone:    Seated:    Standing: wall wrist stretch x20 5" holds B, hand finger abd stretch x20 5" holds,  Neuromuscular Re-education:   Manual Therapy: IASTM with percussion gun to hands 12 minutes total Therapeutic Activity: Self Care: Trigger Point Dry Needling:  Modalities:          PATIENT EDUCATION:  Education details:positional relief in bed with pillows, STM to muscles that connect to sore areas, differences between pilates/yoga/barre classes, importance of mobility of joints and end range strength, review of HEP, on acu-points and stress associated  Person educated: Patient Education method: Explanation, Demonstration, and Handouts Education comprehension: verbalized understanding    HOME EXERCISE PROGRAM: [id number]   ASSESSMENT:   CLINICAL IMPRESSION: Session focused on education of multiple topics including different type of exercise classes, on anatomy and joint mobility, on end range strength, on sleeping postures and positioning, on current presentation and POC moving forward. Answered all questions and discussed daily soft tissue work. Will continue with current POC as tolerated.    OBJECTIVE IMPAIRMENTS decreased activity tolerance, decreased mobility, decreased ROM, decreased strength, postural dysfunction, and pain.    ACTIVITY LIMITATIONS sleeping and hiking   PARTICIPATION LIMITATIONS: community activity   PERSONAL FACTORS 3+ comorbidities: hysterectomy, pelvic reconstruction with mesh, IBS, cesarean section,   are also affecting patient's functional outcome.    REHAB POTENTIAL: Good   CLINICAL DECISION MAKING: Evolving/moderate complexity   EVALUATION COMPLEXITY: Moderate     GOALS: Goals reviewed with patient?  yes   SHORT TERM GOALS:   Patient will be independent in self management strategies to improve quality of life and functional outcomes. Baseline: new program Target date: 09/13/2022 Goal status: INITIAL   2.  Patient will report at least 25% improvement in overall symptoms and/or function to demonstrate improved functional mobility Baseline: 0% Target date: 09/13/2022 Goal status: INITIAL   3.  Patient will be ale to report obtaining comfortable position at night to improve sleep quality. Baseline: unable Target date: 09/13/2022 Goal status: INITIAL       LONG TERM GOALS:   Patient will report at least 75% improvement in overall symptoms and/or function to demonstrate improved functional mobility Baseline: 0% Target date: 10/11/2022 Goal status: INITIAL   2.  Patient will demonstrate at least 4-/5 MMT in LE Baseline: see above Target date: 10/11/2022 Goal status: INITIAL   3.  Patient will be able hike without severe  pain during Baseline: unable Target date: 10/11/2022 Goal status: INITIAL             PLAN: PT FREQUENCY: 2x/week   PT DURATION: 8 weeks   PLANNED INTERVENTIONS: Therapeutic exercises, Therapeutic activity, Neuromuscular re-education, Balance training, Gait training, Patient/Family education, Self Care, Joint mobilization, Vestibular training, Aquatic Therapy, Dry Needling, Cognitive remediation, Electrical stimulation, Spinal manipulation, Spinal mobilization, Cryotherapy, Moist heat, Ultrasound, Ionotophoresis '4mg'$ /ml Dexamethasone, and Manual therapy.   PLAN FOR NEXT SESSION: f/u with 5 minute morning routine, SIJ assessment.   sleeping positions, breath work, core activation     3:34 PM, 09/13/22 Jerene Pitch, DPT Physical Therapy with [facility]

## 2022-09-18 ENCOUNTER — Ambulatory Visit: Payer: Commercial Managed Care - PPO | Admitting: Physician Assistant

## 2022-09-18 NOTE — Progress Notes (Shared)
[name] is a 51 y.o. female here for a follow up of a pre-existing problem.  History of Present Illness:   No chief complaint on file.   HPI  Past Medical History:  Diagnosis Date   Anemia    Asthma    Cesarean delivery delivered    DIVERTICULITIS, HX OF 12/24/2007   DIVERTICULOSIS-COLON 07/21/2009   Hematuria, microscopic 10/26/2017   Chronic, since teens; never cystoscopy.   Irritable bowel syndrome 07/21/2009   Kidney stones    Migraine    Seasonal allergies    URINARY INCONTINENCE 06/15/2008     Social History   Tobacco Use   Smoking status: Former    Types: Cigarettes   Smokeless tobacco: Never   Tobacco comments:    smoked socially in college  Vaping Use   Vaping Use: Never used  Substance Use Topics   Alcohol use: Yes    Comment: glass of wine x 2 week   Drug use: No    Past Surgical History:  Procedure Laterality Date   CESAREAN SECTION     COLONOSCOPY  04/19/2009   diverticulosis    G2 P2     1 c-section, 1 NSVD   LEAP  98   rectocele/cystocele repair  06/2009   right fibula fx     age 53   TOTAL VAGINAL HYSTERECTOMY  06/2009   has her ovaries, and SSF, sling    Family History  Problem Relation Age of Onset   Hyperlipidemia Mother    Hypertension Mother    Colon polyps Mother    Diabetes Maternal Geophysicist/field seismologist    Healthy Daughter    Healthy Daughter    Colon cancer Neg Hx    Asthma Neg Hx    Esophageal cancer Neg Hx    Stomach cancer Neg Hx    Pancreatic cancer Neg Hx    Liver disease Neg Hx     No Known Allergies  Current Medications:   Current Outpatient Medications:    ibuprofen (ADVIL) 200 MG tablet, Take 400 mg by mouth every 6 (six) hours as needed., Disp: , Rfl:    meloxicam (MOBIC) 7.5 MG tablet, TAKE 1 TABLET BY MOUTH EVERY DAY, Disp: 30 tablet, Rfl: 0 No current facility-administered medications for this visit.  Facility-Administered Medications Ordered in Other Visits:    [COMPLETED] sodium chloride 0.9 % nebulizer  solution 3 mL, 3 mL, Nebulization, Once, 3 mL at 12/27/11 1503 **FOLLOWED BY** [COMPLETED] methacholine (PROVOCHOLINE) inhaler solution 0.125 mg, 2 mL, Inhalation, Once, 0.125 mg at 12/27/11 1513 **FOLLOWED BY** [COMPLETED] methacholine (PROVOCHOLINE) inhaler solution 0.5 mg, 2 mL, Inhalation, Once, 0.5 mg at 12/27/11 1512 **FOLLOWED BY** methacholine (PROVOCHOLINE) inhaler solution 2 mg, 2 mL, Inhalation, Once **FOLLOWED BY** methacholine (PROVOCHOLINE) inhaler solution 8 mg, 2 mL, Inhalation, Once **FOLLOWED BY** methacholine (PROVOCHOLINE) inhaler solution 32 mg, 2 mL, Inhalation, Once **FOLLOWED BY** albuterol (PROVENTIL) (5 MG/ML) 0.5% nebulizer solution 2.5 mg, 2.5 mg, Nebulization, Once, Brand Males, MD   Review of Systems:   ROS  Vitals:   There were no vitals filed for this visit.   There is no height or weight on file to calculate BMI.  Physical Exam:   Physical Exam Constitutional:      General: She is not in acute distress.    Appearance: Normal appearance. She is not ill-appearing.  HENT:     Head: Normocephalic and atraumatic.     Right Ear: External ear normal.     Left Ear: External ear normal.  Eyes:     Extraocular Movements: Extraocular movements intact.     Pupils: Pupils are equal, round, and reactive to light.  Cardiovascular:     Rate and Rhythm: Normal rate and regular rhythm.     Heart sounds: Normal heart sounds. No murmur heard.    No gallop.  Pulmonary:     Effort: Pulmonary effort is normal. No respiratory distress.     Breath sounds: Normal breath sounds. No wheezing or rales.  Skin:    General: Skin is warm and dry.  Neurological:     Mental Status: She is alert and oriented to person, place, and time.  Psychiatric:        Judgment: Judgment normal.     Assessment and Plan:   There are no diagnoses linked to this encounter.   I,Verona Buck,acting as a Education administrator for [employer], PA.,have documented all relevant documentation on the  behalf of Inda Coke, PA,as directed by  Inda Coke, PA while in the presence of Inda Coke, [place].   Inda Coke, PA-C

## 2022-09-19 ENCOUNTER — Ambulatory Visit (INDEPENDENT_AMBULATORY_CARE_PROVIDER_SITE_OTHER): Payer: Commercial Managed Care - PPO | Admitting: Physical Therapy

## 2022-09-19 ENCOUNTER — Encounter: Payer: Self-pay | Admitting: Physical Therapy

## 2022-09-19 DIAGNOSIS — M5459 Other low back pain: Secondary | ICD-10-CM

## 2022-09-19 DIAGNOSIS — M6281 Muscle weakness (generalized): Secondary | ICD-10-CM

## 2022-09-19 NOTE — Therapy (Unsigned)
OUTPATIENT PHYSICAL THERAPY TREATMENT NOTE   Patient Name: [name] MRN: [id number] DOB:03/16/1971, 51 y.o., female Today's Date: 09/07/2022    END OF SESSION:   PT End of Session - 09/19/22 1432     Visit Number 9    Number of Visits 16    Date for PT Re-Evaluation 10/11/22    Authorization Type [employer]    Progress Note Due on Visit 19    PT Start Time 1433    PT Stop Time 1516    PT Time Calculation (min) 43 min    Activity Tolerance Patient tolerated treatment well               Past Medical History:  Diagnosis Date   Anemia    Asthma    Cesarean delivery delivered    DIVERTICULITIS, HX OF 12/24/2007   DIVERTICULOSIS-COLON 07/21/2009   Hematuria, microscopic 10/26/2017   Chronic, since teens; never cystoscopy.   Irritable bowel syndrome 07/21/2009   Kidney stones    Migraine    Seasonal allergies    URINARY INCONTINENCE 06/15/2008   Past Surgical History:  Procedure Laterality Date   CESAREAN SECTION     COLONOSCOPY  04/19/2009   diverticulosis    G2 P2     1 c-section, 1 NSVD   LEAP  98   rectocele/cystocele repair  06/2009   right fibula fx     age 51   TOTAL VAGINAL HYSTERECTOMY  06/2009   has her ovaries, and SSF, sling   Patient Active Problem List   Diagnosis Date Noted   History of diverticulitis 05/13/2019   Hematuria, microscopic 10/26/2017   Mild intermittent asthma 01/29/2012   Allergic rhinitis 09/15/2011   Irritable bowel syndrome 07/21/2009   PCP: Inda Coke, PA   REFERRING PROVIDER: Gregor Hams, MD   REFERRING DIAG: M54.41,M54.42,G89.29 (ICD-10-CM) - Chronic right-sided low back pain with bilateral sciatica   Rationale for Evaluation and Treatment Rehabilitation   THERAPY DIAG:  Other low back pain   Muscle weakness (generalized)   ONSET DATE: Aug 1st. 2023   SUBJECTIVE:                                                                                                                                                                                             SUBJECTIVE STATEMENT: 09/20/2022 Reports she wants the pain to go away and she feels like she has more good nights and is sleeping through the night more. States she has been very busy walking and standing and she has been having pain at night. States overall she feels 40% better.  States that  she has had pelvic pain for a while especially with quick turns - feels a pull on the right side. States she goes to bed hurting in the front of her abdomen. States she has a lot of pressure. States that this is a constant pain especially at night. About 2 years ago she started feeling a lot of bilateral leg pain. States this pain has worsened and she is really tossing and turning at night because of the leg pain. States she also has sciatica nerve also gets aggravated too. States she runs but she hasn't been running at all. States that when she goes running now it hurts and increases her pain afterwards.    States earlier this year [place] and she was in so much pain she could barelt keep up or walk the next day. States she does work out 3x/week. States she has been walking more and her blood pressure has been higher.      PERTINENT HISTORY:  IBS, 17 years ago c-section, 12 years ago -hysterectomy (has ovaries), pelvic restoration with mesh   PAIN:  Are you having pain? Yes: NPRS scale: 8/10 last night Pain location: lower back Pain description: stiffness, constant ache at night Aggravating factors: standing after sitting for long periods of time, at night Relieving factors: changing positions     PRECAUTIONS: None   WEIGHT BEARING RESTRICTIONS No   FALLS:  Has patient fallen in last 6 months? 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   PLOF: Independent   PATIENT GOALS to have less pain, be able to hike/work out     OBJECTIVE:                LE Measurements       Lower Extremity Right 09/19/22 Left 09/19/22     A/PROM MMT A/PROM MMT  Hip Flexion WFL 4 WFL 4*  Hip Extension WFL 4- WFL 4-  Hip Abduction        Hip Adduction   4   4  Hip Internal rotation [facility]   [facility]    Hip External rotation [facility]   WFL    Knee Flexion WFL 4 WFL 4  Knee Extension   5   5  Ankle Dorsiflexion          Ankle Plantarflexion          Ankle Inversion          Ankle Eversion           (Blank rows = not tested)            * pain   LUMBAR SPECIAL TESTS:  Neg slump, neg SLR           TODAY'S TREATMENT  09/20/2022 Therapeutic Exercise:    Aerobic: Supine:  Review of HEP - on performing strengthening exercises in AM prior to pain and stretches/feel good exercises in the PM, towel roll in low back 5 minutes, piriformis stretch x5 20" holds B, bent knee fall ins/outs,  Prone: hamstring curls x15, hip extension x15 B    Seated:    Standing:   Neuromuscular Re-education:   Manual Therapy  Therapeutic Activity: Self Care: Trigger Point Dry Needling:  Modalities: thermotherapy to lumbar spine during supine interventions          PATIENT EDUCATION:  Education details:preview of what to do when in pain, at starting morning and night program to help combat pain, on daily BP  monitoring Person educated: Patient Education method: Explanation, Demonstration, and Handouts Education comprehension: verbalized understanding   HOME EXERCISE PROGRAM: [id number]   ASSESSMENT:   CLINICAL IMPRESSION: Session focused on education and discussion of progress and plan moving forward. We have addressed multiple topics in therapy and overall patient's sleep hygiene and pain is much better. Continues to have leg symptoms on hard/difficult days and back pain pretty consistently. Discussed and educated patient on importance of BP secondary to report of having elevated blood pressure. Patient with desire to continue to make lifestyle changes to help improve BP. Overall patient is doing well and has met 2/3 short term goals and is working  towards long term goals. Patient will continue to benefit from skilled PT to improve overall function and QOL   OBJECTIVE IMPAIRMENTS decreased activity tolerance, decreased mobility, decreased ROM, decreased strength, postural dysfunction, and pain.    ACTIVITY LIMITATIONS sleeping and hiking   PARTICIPATION LIMITATIONS: community activity   PERSONAL FACTORS 3+ comorbidities: hysterectomy, pelvic reconstruction with mesh, IBS, cesarean section,   are also affecting patient's functional outcome.    REHAB POTENTIAL: Good   CLINICAL DECISION MAKING: Evolving/moderate complexity   EVALUATION COMPLEXITY: Moderate     GOALS: Goals reviewed with patient?  yes   SHORT TERM GOALS:   Patient will be independent in self management strategies to improve quality of life and functional outcomes. Baseline: new program Target date: 09/13/2022 Goal status: MET   2.  Patient will report at least 25% improvement in overall symptoms and/or function to demonstrate improved functional mobility Baseline: 0% Target date: 09/13/2022 Goal status: MET   3.  Patient will be able to report obtaining comfortable position at night to improve sleep quality. Baseline: unable Target date: 09/13/2022 Goal status: PROGRESSING       LONG TERM GOALS:   Patient will report at least 75% improvement in overall symptoms and/or function to demonstrate improved functional mobility Baseline: 0% Target date: 10/11/2022 Goal status: PROGRESSING   2.  Patient will demonstrate at least 4-/5 MMT in LE Baseline: see above Target date: 10/11/2022 Goal status: PROGRESSING   3.  Patient will be able hike without severe pain during Baseline: unable Target date: 10/11/2022 Goal status: PROGRESSING             PLAN: PT FREQUENCY: 2x/week   PT DURATION: 8 weeks   PLANNED INTERVENTIONS: Therapeutic exercises, Therapeutic activity, Neuromuscular re-education, Balance training, Gait training, Patient/Family  education, Self Care, Joint mobilization, Vestibular training, Aquatic Therapy, Dry Needling, Cognitive remediation, Electrical stimulation, Spinal manipulation, Spinal mobilization, Cryotherapy, Moist heat, Ultrasound, Ionotophoresis 67m/ml Dexamethasone, and Manual therapy.   PLAN FOR NEXT SESSION: f/u with 5 minute morning routine, SIJ assessment.   sleeping positions, breath work, core activation     8:42 AM, 09/20/22 MJerene Pitch DPT [facility]

## 2022-09-20 NOTE — Progress Notes (Unsigned)
   Rito Ehrlich, am serving as a Education administrator for Dr. Lynne Leader.  [name] is a 51 y.o. female who presents to [place] [facility] today for Right sided low back pain. Patient was last seen by Dr. Georgina Snell on 08/10/22 for the same thing stating that the pain would radiate to bilateral leg. Patient was referred to PT and ha been to 9 appointment. Today patient reports that her pain is so much better. States the [employer] is one of the best therapist she has ever been too and she has been doing great. Will still have episodes but nothing like it was.    Pertinent review of systems: No fevers or chills  Relevant historical information: IBS   Exam:  BP (!) 140/90   Pulse 86   Ht 4' 11.5" (1.511 m)   Wt 141 lb (64 kg)   SpO2 98%   BMI 28.00 kg/m  General: Well Developed, well nourished, and in no acute distress.   MSK: Normal lumbar and cervical motion.  Normal gait.     Assessment and Plan: 51 y.o. female with improved chronic neck and back pain.  Patient had dramatic improvement with physical therapy.  She is almost done with physical therapy now and is in the process of consolidating home exercise program.  Stressed the importance of continued exercise program.  Check back with me as needed.  Total encounter time 20 minutes including face-to-face time with the patient and, reviewing past medical record, and charting on the date of service.      Discussed warning signs or symptoms. Please see discharge instructions. Patient expresses understanding.   The above documentation has been reviewed and is accurate and complete Lynne Leader, M.D.

## 2022-09-21 ENCOUNTER — Ambulatory Visit (INDEPENDENT_AMBULATORY_CARE_PROVIDER_SITE_OTHER): Payer: Commercial Managed Care - PPO | Admitting: Family Medicine

## 2022-09-21 ENCOUNTER — Ambulatory Visit: Payer: Commercial Managed Care - PPO | Admitting: Physical Therapy

## 2022-09-21 ENCOUNTER — Encounter: Payer: Self-pay | Admitting: Physical Therapy

## 2022-09-21 VITALS — BP 140/90 | HR 86 | Ht 59.5 in | Wt 141.0 lb

## 2022-09-21 DIAGNOSIS — M6281 Muscle weakness (generalized): Secondary | ICD-10-CM | POA: Diagnosis not present

## 2022-09-21 DIAGNOSIS — M7061 Trochanteric bursitis, right hip: Secondary | ICD-10-CM

## 2022-09-21 DIAGNOSIS — M25511 Pain in right shoulder: Secondary | ICD-10-CM | POA: Diagnosis not present

## 2022-09-21 DIAGNOSIS — G8929 Other chronic pain: Secondary | ICD-10-CM

## 2022-09-21 DIAGNOSIS — M5459 Other low back pain: Secondary | ICD-10-CM | POA: Diagnosis not present

## 2022-09-21 DIAGNOSIS — M5442 Lumbago with sciatica, left side: Secondary | ICD-10-CM

## 2022-09-21 DIAGNOSIS — M7062 Trochanteric bursitis, left hip: Secondary | ICD-10-CM

## 2022-09-21 DIAGNOSIS — M5441 Lumbago with sciatica, right side: Secondary | ICD-10-CM

## 2022-09-21 DIAGNOSIS — M25512 Pain in left shoulder: Secondary | ICD-10-CM

## 2022-09-21 NOTE — Patient Instructions (Signed)
Thank you for coming in today.   Continue PT and home exercises.   Recheck with me as needed.   I can re-order PT if needed in the future.

## 2022-09-21 NOTE — Therapy (Signed)
OUTPATIENT PHYSICAL THERAPY TREATMENT NOTE   Patient Name: [name] MRN: [id number] DOB:03/18/1971, 51 y.o., female Today's Date: 09/07/2022    END OF SESSION:   PT End of Session - 09/21/22 1436     Visit Number 10    Number of Visits 16    Date for PT Re-Evaluation 10/11/22    Authorization Type [employer]    Progress Note Due on Visit 28    PT Start Time 1436    PT Stop Time 1516    PT Time Calculation (min) 40 min    Activity Tolerance Patient tolerated treatment well               Past Medical History:  Diagnosis Date   Anemia    Asthma    Cesarean delivery delivered    DIVERTICULITIS, HX OF 12/24/2007   DIVERTICULOSIS-COLON 07/21/2009   Hematuria, microscopic 10/26/2017   Chronic, since teens; never cystoscopy.   Irritable bowel syndrome 07/21/2009   Kidney stones    Migraine    Seasonal allergies    URINARY INCONTINENCE 06/15/2008   Past Surgical History:  Procedure Laterality Date   CESAREAN SECTION     COLONOSCOPY  04/19/2009   diverticulosis    G2 P2     1 c-section, 1 NSVD   LEAP  98   rectocele/cystocele repair  06/2009   right fibula fx     age 46   TOTAL VAGINAL HYSTERECTOMY  06/2009   has her ovaries, and SSF, sling   Patient Active Problem List   Diagnosis Date Noted   History of diverticulitis 05/13/2019   Hematuria, microscopic 10/26/2017   Mild intermittent asthma 01/29/2012   Allergic rhinitis 09/15/2011   Irritable bowel syndrome 07/21/2009   PCP: Inda Coke, PA   REFERRING PROVIDER: Gregor Hams, MD   REFERRING DIAG: M54.41,M54.42,G89.29 (ICD-10-CM) - Chronic right-sided low back pain with bilateral sciatica   Rationale for Evaluation and Treatment Rehabilitation   THERAPY DIAG:  Other low back pain   Muscle weakness (generalized)   ONSET DATE: Aug 1st. 2023   SUBJECTIVE:                                                                                                                                                                                             SUBJECTIVE STATEMENT: 09/21/2022 States she used a heating pad and that felt good last night. States she feels pretty good today but has some back/pelvic pain.  States that she has had pelvic pain for a while especially with quick turns - feels a pull on the right side. States she goes to  bed hurting in the front of her abdomen. States she has a lot of pressure. States that this is a constant pain especially at night. About 2 years ago she started feeling a lot of bilateral leg pain. States this pain has worsened and she is really tossing and turning at night because of the leg pain. States she also has sciatica nerve also gets aggravated too. States she runs but she hasn't been running at all. States that when she goes running now it hurts and increases her pain afterwards.    States earlier this year [place] and she was in so much pain she could barelt keep up or walk the next day. States she does work out 3x/week. States she has been walking more and her blood pressure has been higher.      PERTINENT HISTORY:  IBS, 17 years ago c-section, 12 years ago -hysterectomy (has ovaries), pelvic restoration with mesh   PAIN:  Are you having pain? Yes: NPRS scale: 4/10   Pain location: lower back Pain description: stiffness, constant ache at night Aggravating factors: standing after sitting for long periods of time, at night Relieving factors: changing positions     PRECAUTIONS: None   WEIGHT BEARING RESTRICTIONS No   FALLS:  Has patient fallen in last 6 months? 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   PLOF: Independent   PATIENT GOALS to have less pain, be able to hike/work out     OBJECTIVE:                LE Measurements       Lower Extremity Right 09/19/22 Left 09/19/22    A/PROM MMT A/PROM MMT  Hip Flexion WFL 4 WFL 4*  Hip Extension WFL 4- WFL 4-  Hip Abduction        Hip Adduction   4   4  Hip  Internal rotation [facility]   [facility]    Hip External rotation [facility]   WFL    Knee Flexion WFL 4 WFL 4  Knee Extension   5   5  Ankle Dorsiflexion          Ankle Plantarflexion          Ankle Inversion          Ankle Eversion           (Blank rows = not tested)            * pain   LUMBAR SPECIAL TESTS:  Neg slump, neg SLR           TODAY'S TREATMENT  09/21/2022 Therapeutic Exercise:    Aerobic: Supine:  Review of HEP Prone:      Seated:    Standing:  self STM to hips/glutes with percussion gun 6 minutes Neuromuscular Re-education:  hip hinge over table - 20 minutes - tactile and verbal cues, transitioned to squat with tactile and verbal cues 9 minutes  Manual Therapy  Therapeutic Activity: Self Care: Trigger Point Dry Needling:  Modalities: thermotherapy to lumbar spine during supine interventions          PATIENT EDUCATION:  Education details:preview of what to do when in pain, at starting morning and night program to help combat pain, on daily BP monitoring Person educated: Patient Education method: Explanation, Demonstration, and Handouts Education comprehension: verbalized understanding   HOME EXERCISE PROGRAM: [id number]   ASSESSMENT:   CLINICAL IMPRESSION: Overall patient is doing well, reviewed HEP  and answered all questions. Added movement retraining exercise of hip hinge which was difficult but improved after soft tissue work. Added to HEP and discussed reducing frequency to 1x/2 weeks to continue to work on remaining goals and allow patient sufficient time to work on new interventions. Patient would continue to benefit from skilled PT to improve overall function and QOL.    OBJECTIVE IMPAIRMENTS decreased activity tolerance, decreased mobility, decreased ROM, decreased strength, postural dysfunction, and pain.    ACTIVITY LIMITATIONS sleeping and hiking   PARTICIPATION LIMITATIONS: community activity   PERSONAL FACTORS 3+ comorbidities: hysterectomy, pelvic  reconstruction with mesh, IBS, cesarean section,   are also affecting patient's functional outcome.    REHAB POTENTIAL: Good   CLINICAL DECISION MAKING: Evolving/moderate complexity   EVALUATION COMPLEXITY: Moderate     GOALS: Goals reviewed with patient?  yes   SHORT TERM GOALS:   Patient will be independent in self management strategies to improve quality of life and functional outcomes. Baseline: new program Target date: 09/13/2022 Goal status: MET   2.  Patient will report at least 25% improvement in overall symptoms and/or function to demonstrate improved functional mobility Baseline: 0% Target date: 09/13/2022 Goal status: MET   3.  Patient will be able to report obtaining comfortable position at night to improve sleep quality. Baseline: unable Target date: 09/13/2022 Goal status: PROGRESSING       LONG TERM GOALS:   Patient will report at least 75% improvement in overall symptoms and/or function to demonstrate improved functional mobility Baseline: 0% Target date: 10/11/2022 Goal status: PROGRESSING   2.  Patient will demonstrate at least 4-/5 MMT in LE Baseline: see above Target date: 10/11/2022 Goal status: PROGRESSING   3.  Patient will be able hike without severe pain during Baseline: unable Target date: 10/11/2022 Goal status: PROGRESSING             PLAN: PT FREQUENCY: 2x/week   PT DURATION: 8 weeks   PLANNED INTERVENTIONS: Therapeutic exercises, Therapeutic activity, Neuromuscular re-education, Balance training, Gait training, Patient/Family education, Self Care, Joint mobilization, Vestibular training, Aquatic Therapy, Dry Needling, Cognitive remediation, Electrical stimulation, Spinal manipulation, Spinal mobilization, Cryotherapy, Moist heat, Ultrasound, Ionotophoresis 81m/ml Dexamethasone, and Manual therapy.   PLAN FOR NEXT SESSION: f/u w/ hip hinge   4:14 PM, 09/21/22 MJerene Pitch DPT Physical Therapy with [facility]

## 2022-10-11 ENCOUNTER — Ambulatory Visit: Payer: Commercial Managed Care - PPO | Admitting: Physical Therapy

## 2022-10-11 ENCOUNTER — Encounter: Payer: Self-pay | Admitting: Physical Therapy

## 2022-10-11 DIAGNOSIS — M6281 Muscle weakness (generalized): Secondary | ICD-10-CM

## 2022-10-11 DIAGNOSIS — M5459 Other low back pain: Secondary | ICD-10-CM

## 2022-10-11 NOTE — Therapy (Signed)
OUTPATIENT PHYSICAL THERAPY TREATMENT NOTE   Patient Name: [name] MRN: [id number] DOB:02-Mar-1971, 51 y.o., female Today's Date: 09/07/2022    END OF SESSION:   PT End of Session - 10/11/22 1432     Visit Number 11    Number of Visits 16    Date for PT Re-Evaluation 10/11/22    Authorization Type U[employer]    Progress Note Due on Visit 19    PT Start Time 1433    PT Stop Time 1515    PT Time Calculation (min) 42 min    Activity Tolerance Patient tolerated treatment well               Past Medical History:  Diagnosis Date   Anemia    Asthma    Cesarean delivery delivered    DIVERTICULITIS, HX OF 12/24/2007   DIVERTICULOSIS-COLON 07/21/2009   Hematuria, microscopic 10/26/2017   Chronic, since teens; never cystoscopy.   Irritable bowel syndrome 07/21/2009   Kidney stones    Migraine    Seasonal allergies    URINARY INCONTINENCE 06/15/2008   Past Surgical History:  Procedure Laterality Date   CESAREAN SECTION     COLONOSCOPY  04/19/2009   diverticulosis    G2 P2     1 c-section, 1 NSVD   LEAP  98   rectocele/cystocele repair  06/2009   right fibula fx     age 80   TOTAL VAGINAL HYSTERECTOMY  06/2009   has her ovaries, and SSF, sling   Patient Active Problem List   Diagnosis Date Noted   History of diverticulitis 05/13/2019   Hematuria, microscopic 10/26/2017   Mild intermittent asthma 01/29/2012   Allergic rhinitis 09/15/2011   Irritable bowel syndrome 07/21/2009   PCP: Inda Coke, PA   REFERRING PROVIDER: Gregor Hams, MD   REFERRING DIAG: M54.41,M54.42,G89.29 (ICD-10-CM) - Chronic right-sided low back pain with bilateral sciatica   Rationale for Evaluation and Treatment Rehabilitation   THERAPY DIAG:  Other low back pain   Muscle weakness (generalized)   ONSET DATE: Aug 1st. 2023   SUBJECTIVE:                                                                                                                                                                                             SUBJECTIVE STATEMENT: 10/11/2022 States her legs feel a lot better, she has occasion pain. States she is sleeping and has been stretching more. States she ran a mile the other day and had occasional right lower hip/quadrant pain. States that she could do the whole mile all at once. States she has been  going to the gym.  States that she has had pelvic pain for a while especially with quick turns - feels a pull on the right side. States she goes to bed hurting in the front of her abdomen. States she has a lot of pressure. States that this is a constant pain especially at night. About 2 years ago she started feeling a lot of bilateral leg pain. States this pain has worsened and she is really tossing and turning at night because of the leg pain. States she also has sciatica nerve also gets aggravated too. States she runs but she hasn't been running at all. States that when she goes running now it hurts and increases her pain afterwards.    States earlier this year she [place] and she was in so much pain she could barelt keep up or walk the next day. States she does work out 3x/week. States she has been walking more and her blood pressure has been higher.      PERTINENT HISTORY:  IBS, 17 years ago c-section, 12 years ago -hysterectomy (has ovaries), pelvic restoration with mesh   PAIN:  Are you having pain? Yes: NPRS scale: 4/10   Pain location: lower back Pain description: stiffness, constant ache at night Aggravating factors: standing after sitting for long periods of time, at night Relieving factors: changing positions     PRECAUTIONS: None   WEIGHT BEARING RESTRICTIONS No   FALLS:  Has patient fallen in last 6 months? 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   PLOF: Independent   PATIENT GOALS to have less pain, be able to hike/work out     OBJECTIVE:                LE Measurements       Lower Extremity  Right 09/19/22 Left 09/19/22    A/PROM MMT A/PROM MMT  Hip Flexion WFL 4 WFL 4*  Hip Extension WFL 4- WFL 4-  Hip Abduction        Hip Adduction   4   4  Hip Internal rotation WLF   [facility]    Hip External rotation [facility]   WFL    Knee Flexion WFL 4 WFL 4  Knee Extension   5   5  Ankle Dorsiflexion          Ankle Plantarflexion          Ankle Inversion          Ankle Eversion           (Blank rows = not tested)            * pain   LUMBAR SPECIAL TESTS:  Neg slump, neg SLR           TODAY'S TREATMENT  10/11/2022 Therapeutic Exercise:    Aerobic: incline treadmill 2.5 mph 10 minutes  Supine:  Review of HEP Prone:      Seated:    Standing:    Neuromuscular Re-education:   Manual Therapy  Therapeutic Activity: Self Care: Trigger Point Dry Needling:  Modalities: thermotherapy to lumbar spine during supine interventions          PATIENT EDUCATION:  Education details: on pilates, yoga, barre and different classes, on HR ranges and goals and return to PLOF.  Person educated: Patient Education method: Explanation, Demonstration, and Handouts Education comprehension: verbalized understanding   HOME EXERCISE PROGRAM: [id number]   ASSESSMENT:   CLINICAL IMPRESSION: Session focused  on review of home exercise program and current goals to initiate running and athletic classes.  Reviewed different types of classes such as Pilates, yoga, barre and benefits of each.  Discussed heart rate range and target heart rate for current goal of weight loss.  Discussed longer.  And moderate intensity aerobic exercises and practiced incline fast-paced walking in clinic today.  Overall patient is doing very well and progressing towards goals.  We will continue with current plan of care as tolerated.   OBJECTIVE IMPAIRMENTS decreased activity tolerance, decreased mobility, decreased ROM, decreased strength, postural dysfunction, and pain.    ACTIVITY LIMITATIONS sleeping and hiking    PARTICIPATION LIMITATIONS: community activity   PERSONAL FACTORS 3+ comorbidities: hysterectomy, pelvic reconstruction with mesh, IBS, cesarean section,   are also affecting patient's functional outcome.    REHAB POTENTIAL: Good   CLINICAL DECISION MAKING: Evolving/moderate complexity   EVALUATION COMPLEXITY: Moderate     GOALS: Goals reviewed with patient?  yes   SHORT TERM GOALS:   Patient will be independent in self management strategies to improve quality of life and functional outcomes. Baseline: new program Target date: 09/13/2022 Goal status: MET   2.  Patient will report at least 25% improvement in overall symptoms and/or function to demonstrate improved functional mobility Baseline: 0% Target date: 09/13/2022 Goal status: MET   3.  Patient will be able to report obtaining comfortable position at night to improve sleep quality. Baseline: unable Target date: 09/13/2022 Goal status: PROGRESSING       LONG TERM GOALS:   Patient will report at least 75% improvement in overall symptoms and/or function to demonstrate improved functional mobility Baseline: 0% Target date: 10/11/2022 Goal status: PROGRESSING   2.  Patient will demonstrate at least 4-/5 MMT in LE Baseline: see above Target date: 10/11/2022 Goal status: PROGRESSING   3.  Patient will be able hike without severe pain during Baseline: unable Target date: 10/11/2022 Goal status: PROGRESSING             PLAN: PT FREQUENCY: 2x/week   PT DURATION: 8 weeks   PLANNED INTERVENTIONS: Therapeutic exercises, Therapeutic activity, Neuromuscular re-education, Balance training, Gait training, Patient/Family education, Self Care, Joint mobilization, Vestibular training, Aquatic Therapy, Dry Needling, Cognitive remediation, Electrical stimulation, Spinal manipulation, Spinal mobilization, Cryotherapy, Moist heat, Ultrasound, Ionotophoresis 67m/ml Dexamethasone, and Manual therapy.   PLAN FOR NEXT SESSION:  f/u w/ hip hinge   3:17 PM, 10/11/22 MJerene Pitch DPT Physical Therapy with [place]

## 2022-11-01 ENCOUNTER — Encounter: Payer: Self-pay | Admitting: Physical Therapy

## 2022-11-01 ENCOUNTER — Ambulatory Visit (INDEPENDENT_AMBULATORY_CARE_PROVIDER_SITE_OTHER): Payer: Commercial Managed Care - PPO | Admitting: Physical Therapy

## 2022-11-01 DIAGNOSIS — M6281 Muscle weakness (generalized): Secondary | ICD-10-CM | POA: Diagnosis not present

## 2022-11-01 DIAGNOSIS — M5459 Other low back pain: Secondary | ICD-10-CM

## 2022-11-01 NOTE — Therapy (Signed)
OUTPATIENT PHYSICAL THERAPY TREATMENT NOTE and RECERT and Discharge  PHYSICAL THERAPY DISCHARGE SUMMARY  Visits from Start of Care: 12  Current functional level related to goals / functional outcomes: See below   Remaining deficits: See below   Education / Equipment: See below   Patient agrees to discharge. Patient goals were partially met. Patient is being discharged due to being pleased with the current functional level.   Patient Name: [name] MRN: [id number] DOB:[birth year], 51 y.o., female Today's Date: 09/07/2022    END OF SESSION:   PT End of Session - 11/01/22 1452     Visit Number 12    Number of Visits 16    Date for PT Re-Evaluation 10/11/22    Authorization Type U[employer]    Progress Note Due on Visit 66    PT Start Time 1450   late to apt   PT Stop Time 1515    PT Time Calculation (min) 25 min    Activity Tolerance Patient tolerated treatment well                Past Medical History:  Diagnosis Date   Anemia    Asthma    Cesarean delivery delivered    DIVERTICULITIS, HX OF 12/24/2007   DIVERTICULOSIS-COLON 07/21/2009   Hematuria, microscopic 10/26/2017   Chronic, since teens; never cystoscopy.   Irritable bowel syndrome 07/21/2009   Kidney stones    Migraine    Seasonal allergies    URINARY INCONTINENCE 06/15/2008   Past Surgical History:  Procedure Laterality Date   CESAREAN SECTION     COLONOSCOPY  04/19/2009   diverticulosis    G2 P2     1 c-section, 1 NSVD   LEAP  98   rectocele/cystocele repair  06/2009   right fibula fx     age 57   TOTAL VAGINAL HYSTERECTOMY  06/2009   has her ovaries, and SSF, sling   Patient Active Problem List   Diagnosis Date Noted   History of diverticulitis 05/13/2019   Hematuria, microscopic 10/26/2017   Mild intermittent asthma 01/29/2012   Allergic rhinitis 09/15/2011   Irritable bowel syndrome 07/21/2009   PCP: Inda Coke, PA   REFERRING PROVIDER: Gregor Hams, MD   REFERRING DIAG:  M54.41,M54.42,G89.29 (ICD-10-CM) - Chronic right-sided low back pain with bilateral sciatica   Rationale for Evaluation and Treatment Rehabilitation   THERAPY DIAG:  Other low back pain   Muscle weakness (generalized)   ONSET DATE: Aug 1st. 2023   SUBJECTIVE:  SUBJECTIVE STATEMENT: 11/01/2022 States she ran a 5k on thanksgiving and she felt good. Afterwards like the next couple days she felt a heaviness in her lungs as if she breathed very hard. States it didn't change with forceful breathing. States that pain went away. States with pilates at times it would hurt. Stats last night   States that she has had pelvic pain for a while especially with quick turns - feels a pull on the right side. States she goes to bed hurting in the front of her abdomen. States she has a lot of pressure. States that this is a constant pain especially at night. About 2 years ago she started feeling a lot of bilateral leg pain. States this pain has worsened and she is really tossing and turning at night because of the leg pain. States she also has sciatica nerve also gets aggravated too. States she runs but she hasn't been running at all. States that when she goes running now it hurts and increases her pain afterwards.    States earlier this year she [place] and she was in so much pain she could barelt keep up or walk the next day. States she does work out 3x/week. States she has been walking more and her blood pressure has been higher.      PERTINENT HISTORY:  IBS, 17 years ago c-section, 12 years ago -hysterectomy (has ovaries), pelvic restoration with mesh   PAIN:  Are you having pain? Yes: NPRS scale: 4/10   Pain location: lower back Pain description: stiffness, constant ache at night Aggravating factors: standing after  sitting for long periods of time, at night Relieving factors: changing positions     PRECAUTIONS: None   WEIGHT BEARING RESTRICTIONS No   FALLS:  Has patient fallen in last 6 months? 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   PLOF: Independent   PATIENT GOALS to have less pain, be able to hike/work out     OBJECTIVE:                LE Measurements       Lower Extremity Right 09/19/22 Left 09/19/22    A/PROM MMT A/PROM MMT  Hip Flexion WFL 4 WFL 4*  Hip Extension WFL 4- WFL 4-  Hip Abduction        Hip Adduction   4   4  Hip Internal rotation WLF   [facility]    Hip External rotation [facility]   WFL    Knee Flexion WFL 4 WFL 4  Knee Extension   5   5  Ankle Dorsiflexion          Ankle Plantarflexion          Ankle Inversion          Ankle Eversion           (Blank rows = not tested)            * pain   LUMBAR SPECIAL TESTS:  Neg slump, neg SLR           TODAY'S TREATMENT  11/01/2022 Therapeutic Exercise:    Aerobic: I  Supine:  Review of HEP Prone:      Seated:    Standing:    Neuromuscular Re-education:   Manual Therapy  Therapeutic Activity: Self Care: Trigger Point Dry Needling:  Modalities          PATIENT EDUCATION:  Education details: on pilates, progress made,  triggers for GERD/CARDIAC symptoms, ways to manage GERD, BP monitoring, Person educated: Patient Education method: Explanation, Demonstration, and Handouts Education comprehension: verbalized understanding   HOME EXERCISE PROGRAM: [id number]   ASSESSMENT:   CLINICAL IMPRESSION: 11/01/2022 BP 129/87 HR 86 Session focused on education secondary to patient concerned about possible cardiac symptoms. Discussed presentation for GERD and Cardiac and checked BP/HR while in clinic. Patient to follow up with PCP on Friday and has no current symptoms. Discussed what to do if symptoms return/persist (go to ER). Overall patient has done well and has returned to running and pilates.  Reviewed goals and is progressing towards goals. Patient to discharge from PT to HEP at this time secondary to progress made. Discussed requesting return to PT if symptoms return and if chest symptoms continue and GI/Cardiac has been ruled out. Answered all questions prior to end of session.    Session focused on review of home exercise program and current goals to initiate running and athletic classes.  Reviewed different types of classes such as Pilates, yoga, barre and benefits of each.  Discussed heart rate range and target heart rate for current goal of weight loss.  Discussed longer.  And moderate intensity aerobic exercises and practiced incline fast-paced walking in clinic today.  Overall patient is doing very well and progressing towards goals.  We will continue with current plan of care as tolerated.   OBJECTIVE IMPAIRMENTS decreased activity tolerance, decreased mobility, decreased ROM, decreased strength, postural dysfunction, and pain.    ACTIVITY LIMITATIONS sleeping and hiking   PARTICIPATION LIMITATIONS: community activity   PERSONAL FACTORS 3+ comorbidities: hysterectomy, pelvic reconstruction with mesh, IBS, cesarean section,   are also affecting patient's functional outcome.    REHAB POTENTIAL: Good   CLINICAL DECISION MAKING: Evolving/moderate complexity   EVALUATION COMPLEXITY: Moderate     GOALS: Goals reviewed with patient?  yes   SHORT TERM GOALS:   Patient will be independent in self management strategies to improve quality of life and functional outcomes. Baseline: new program Target date: 09/13/2022 Goal status: MET   2.  Patient will report at least 25% improvement in overall symptoms and/or function to demonstrate improved functional mobility Baseline: 0% Target date: 09/13/2022 Goal status: MET   3.  Patient will be able to report obtaining comfortable position at night to improve sleep quality. Baseline: unable Target date: 09/13/2022 Goal status:  PROGRESSING       LONG TERM GOALS:   Patient will report at least 75% improvement in overall symptoms and/or function to demonstrate improved functional mobility Baseline: 0% Target date: 10/11/2022 Goal status: PROGRESSING   2.  Patient will demonstrate at least 4-/5 MMT in LE Baseline: see above Target date: 10/11/2022 Goal status: PROGRESSING   3.  Patient will be able hike without severe pain during Baseline: unable Target date: 10/11/2022 Goal status: PROGRESSING             PLAN: PT FREQUENCY: 2x/week   PT DURATION: 8 weeks   PLANNED INTERVENTIONS: Therapeutic exercises, Therapeutic activity, Neuromuscular re-education, Balance training, Gait training, Patient/Family education, Self Care, Joint mobilization, Vestibular training, Aquatic Therapy, Dry Needling, Cognitive remediation, Electrical stimulation, Spinal manipulation, Spinal mobilization, Cryotherapy, Moist heat, Ultrasound, Ionotophoresis 44m/ml Dexamethasone, and Manual therapy.   PLAN FOR NEXT SESSION: f/u w/ hip hinge   4:35 PM, 11/01/22 MJerene Pitch DPT Physical Therapy with

## 2022-11-03 ENCOUNTER — Encounter: Payer: Self-pay | Admitting: Physician Assistant

## 2022-11-03 ENCOUNTER — Ambulatory Visit: Payer: Commercial Managed Care - PPO | Admitting: Physician Assistant

## 2022-11-03 VITALS — BP 142/92 | HR 75 | Temp 98.0°F | Ht 59.5 in | Wt 143.6 lb

## 2022-11-03 DIAGNOSIS — R030 Elevated blood-pressure reading, without diagnosis of hypertension: Secondary | ICD-10-CM | POA: Diagnosis not present

## 2022-11-03 DIAGNOSIS — K219 Gastro-esophageal reflux disease without esophagitis: Secondary | ICD-10-CM | POA: Diagnosis not present

## 2022-11-03 MED ORDER — AMLODIPINE BESYLATE 5 MG PO TABS: 5.0000 mg | ORAL_TABLET | Freq: Every day | ORAL | 0 refills | Status: DC

## 2022-11-03 NOTE — Progress Notes (Signed)
[name] is a 51 y.o. female here for a follow up of a pre-existing problem.  History of Present Illness:   Chief Complaint  Patient presents with   Hypertension   Heartburn    Started a couple of weeks ago. She denies nausea or vomiting. She says it started after she ran a 5K.     HPI   Hypertension Currently taking no medication. At home blood pressure readings are: around 150/90 . Patient denies chest pain, SOB, blurred vision, dizziness, unusual headaches, lower leg swelling. Patient is compliant with medication. Denies excessive caffeine intake, stimulant usage, excessive alcohol intake, or increase in salt consumption.  BP Readings from Last 3 Encounters:  11/03/22 (!) 142/92  09/21/22 (!) 140/90  08/10/22 (!) 160/98    Heartburn Patient is complaining of intermittent chest tightness that occurred after completing 5k run, but not right afterwards.  She reports that it hurts with movement and not with breathing. She explains when eating dinner with her family 3 days ago, she experienced chest tightness to the point where she was trying to control her breathing and became very worried. Patient reports that she manages her symptoms with zantac prn and with diet by eating yogurt and probiotics. She denies lightheadedness, jaw pain, and arm pain.    Past Medical History:  Diagnosis Date   Anemia    Asthma    Cesarean delivery delivered    DIVERTICULITIS, HX OF 12/24/2007   DIVERTICULOSIS-COLON 07/21/2009   Hematuria, microscopic 10/26/2017   Chronic, since teens; never cystoscopy.   Irritable bowel syndrome 07/21/2009   Kidney stones    Migraine    Seasonal allergies    URINARY INCONTINENCE 06/15/2008     Social History   Tobacco Use   Smoking status: Former    Types: Cigarettes   Smokeless tobacco: Never   Tobacco comments:    smoked socially in college  Vaping Use   Vaping Use: Never used  Substance Use Topics   Alcohol use: Yes    Comment: glass of wine  x 2 week   Drug use: No    Past Surgical History:  Procedure Laterality Date   CESAREAN SECTION     COLONOSCOPY  04/19/2009   diverticulosis    G2 P2     1 c-section, 1 NSVD   LEAP  98   rectocele/cystocele repair  06/2009   right fibula fx     age 67   TOTAL VAGINAL HYSTERECTOMY  06/2009   has her ovaries, and SSF, sling    Family History  Problem Relation Age of Onset   Hyperlipidemia Mother    Hypertension Mother    Colon polyps Mother    Diabetes Maternal Geophysicist/field seismologist    Healthy Daughter    Healthy Daughter    Colon cancer Neg Hx    Asthma Neg Hx    Esophageal cancer Neg Hx    Stomach cancer Neg Hx    Pancreatic cancer Neg Hx    Liver disease Neg Hx     No Known Allergies  Current Medications:   Current Outpatient Medications:    amLODipine (NORVASC) 5 MG tablet, Take 1 tablet (5 mg total) by mouth daily., Disp: 90 tablet, Rfl: 0   ibuprofen (ADVIL) 200 MG tablet, Take 400 mg by mouth every 6 (six) hours as needed., Disp: , Rfl:    meloxicam (MOBIC) 7.5 MG tablet, TAKE 1 TABLET BY MOUTH EVERY DAY (Patient not taking: Reported on 09/21/2022), Disp: 30 tablet, Rfl:  0 No current facility-administered medications for this visit.  Facility-Administered Medications Ordered in Other Visits:    [COMPLETED] sodium chloride 0.9 % nebulizer solution 3 mL, 3 mL, Nebulization, Once, 3 mL at 12/27/11 1503 **FOLLOWED BY** [COMPLETED] methacholine (PROVOCHOLINE) inhaler solution 0.125 mg, 2 mL, Inhalation, Once, 0.125 mg at 12/27/11 1513 **FOLLOWED BY** [COMPLETED] methacholine (PROVOCHOLINE) inhaler solution 0.5 mg, 2 mL, Inhalation, Once, 0.5 mg at 12/27/11 1512 **FOLLOWED BY** methacholine (PROVOCHOLINE) inhaler solution 2 mg, 2 mL, Inhalation, Once **FOLLOWED BY** methacholine (PROVOCHOLINE) inhaler solution 8 mg, 2 mL, Inhalation, Once **FOLLOWED BY** methacholine (PROVOCHOLINE) inhaler solution 32 mg, 2 mL, Inhalation, Once **FOLLOWED BY** albuterol (PROVENTIL) (5 MG/ML) 0.5%  nebulizer solution 2.5 mg, 2.5 mg, Nebulization, Once, Brand Males, MD   Review of Systems:   ROS Negative unless otherwise specified per HPI.  Vitals:   Vitals:   11/03/22 1306  BP: (!) 142/92  Pulse: 75  Temp: 98 F (36.7 C)  TempSrc: Temporal  SpO2: 99%  Weight: 143 lb 9.6 oz (65.1 kg)  Height: 4' 11.5" (1.511 m)     Body mass index is 28.52 kg/m.  Physical Exam:   Physical Exam Constitutional:      General: She is not in acute distress.    Appearance: Normal appearance. She is not ill-appearing.  HENT:     Head: Normocephalic and atraumatic.     Right Ear: External ear normal.     Left Ear: External ear normal.  Eyes:     Extraocular Movements: Extraocular movements intact.     Pupils: Pupils are equal, round, and reactive to light.  Cardiovascular:     Rate and Rhythm: Normal rate and regular rhythm.     Heart sounds: Normal heart sounds. No murmur heard.    No gallop.  Pulmonary:     Effort: Pulmonary effort is normal. No respiratory distress.     Breath sounds: Normal breath sounds. No wheezing or rales.  Skin:    General: Skin is warm and dry.  Neurological:     Mental Status: She is alert and oriented to person, place, and time.  Psychiatric:        Judgment: Judgment normal.     Assessment and Plan:   Gastroesophageal reflux disease, unspecified whether esophagitis present EKG tracing is personally reviewed.  EKG notes NSR.  No acute changes.  Uncontrolled Recommend zantac daily x 2 weeks Follow-up if new/worsening/lack of improvement Low threshold to refer to GI  Elevated blood pressure reading Above goal Start amlodopine 5 mg daily Check BP regularly and follow-up in 3 months -- sooner if concerns  I,Verona Buck,acting as a scribe for [employer], PA.,have documented all relevant documentation on the behalf of Inda Coke, PA,as directed by  Inda Coke, PA while in the presence of Inda Coke, [place].  I, Inda Coke, [place], have reviewed all documentation for this visit. The documentation on 11/03/22 for the exam, diagnosis, procedures, and orders are all accurate and complete.  Inda Coke, PA-C

## 2022-11-03 NOTE — Patient Instructions (Signed)
It was great to see you!  EKG looks stable  Start 5 mg amlodopine for your blood pressure Continue to check BP 1-2 times per week  Start zantac daily x 2 weeks and see how this does with your heartburn  Follow-up in 3 months, sooner if concerns

## 2023-01-04 LAB — HM MAMMOGRAPHY

## 2023-01-22 NOTE — Progress Notes (Signed)
52 y.o. G24P2002 Married Other or two or more races Hispanic or Latino female here for annual exam.  H/O TAH, A&P repair in 2010. Only occasional vasomotor symptoms.  Sexually active. Occasional deep dyspareunia (1/10 x she has intercourse), positional.   She has an external hemorrhoid, reports intermittent irritation. She has anusol to use if needed.      She was started on an antihypertensive in 12/23.   No LMP recorded. Patient has had a hysterectomy.          Sexually active: Yes.    The current method of family planning is status post hysterectomy.    Exercising: Yes.     Walking  Smoker:  no  Health Maintenance: Pap: 10-07-20 normal, negative hpv History of abnormal Pap:  yes, H/O LEEP in 1998 MMG:  01/04/23 Bi-rads 1 neg  BMD:   Never Colonoscopy: 09-29-20 sessile polyp, f/u 5 years TDaP:  2016 Gardasil: N/A   reports that she has quit smoking. Her smoking use included cigarettes. She has never used smokeless tobacco. She reports current alcohol use. She reports that she does not use drugs.  3 glasses of wine a week. Works in [employer]. Daughters are 46 and 32, splits custody with her ex.    Past Medical History:  Diagnosis Date   Anemia    Asthma    Cesarean delivery delivered    DIVERTICULITIS, HX OF 12/24/2007   DIVERTICULOSIS-COLON 07/21/2009   Hematuria, microscopic 10/26/2017   Chronic, since teens; never cystoscopy.   Irritable bowel syndrome 07/21/2009   Kidney stones    Migraine    Seasonal allergies    URINARY INCONTINENCE 06/15/2008    Past Surgical History:  Procedure Laterality Date   CESAREAN SECTION     COLONOSCOPY  04/19/2009   diverticulosis    G2 P2     1 c-section, 1 NSVD   LEAP  98   rectocele/cystocele repair  06/2009   right fibula fx     age 34   TOTAL VAGINAL HYSTERECTOMY  06/2009   has her ovaries, and SSF, sling    Current Outpatient Medications  Medication Sig Dispense Refill   amLODipine (NORVASC) 5 MG tablet Take 1 tablet (5 mg  total) by mouth daily. 90 tablet 0   ibuprofen (ADVIL) 200 MG tablet Take 400 mg by mouth every 6 (six) hours as needed.     meloxicam (MOBIC) 7.5 MG tablet TAKE 1 TABLET BY MOUTH EVERY DAY 30 tablet 0   No current facility-administered medications for this visit.   Facility-Administered Medications Ordered in Other Visits  Medication Dose Route Frequency Provider Last Rate Last Admin   methacholine (PROVOCHOLINE) inhaler solution 2 mg  2 mL Inhalation Once Brand Males, MD       Followed by   methacholine (PROVOCHOLINE) inhaler solution 8 mg  2 mL Inhalation Once Brand Males, MD       Followed by   methacholine (PROVOCHOLINE) inhaler solution 32 mg  2 mL Inhalation Once Brand Males, MD       Followed by   albuterol (PROVENTIL) (5 MG/ML) 0.5% nebulizer solution 2.5 mg  2.5 mg Nebulization Once Brand Males, MD        Family History  Problem Relation Age of Onset   Hyperlipidemia Mother    Hypertension Mother    Colon polyps Mother    Diabetes Maternal Grandmother    Healthy Daughter    Healthy Daughter    Colon cancer Neg Hx  Asthma Neg Hx    Esophageal cancer Neg Hx    Stomach cancer Neg Hx    Pancreatic cancer Neg Hx    Liver disease Neg Hx     Review of Systems  All other systems reviewed and are negative.   Exam:   BP 110/70   Pulse 68   Ht 4' 11.5" (1.511 m)   Wt 142 lb (64.4 kg)   SpO2 100%   BMI 28.20 kg/m   Weight change: '@WEIGHTCHANGE'$ @ Height:   Height: 4' 11.5" (151.1 cm)  Ht Readings from Last 3 Encounters:  01/30/23 4' 11.5" (1.511 m)  11/03/22 4' 11.5" (1.511 m)  09/21/22 4' 11.5" (1.511 m)    General appearance: alert, cooperative and appears stated age Head: Normocephalic, without obvious abnormality, atraumatic Neck: no adenopathy, supple, symmetrical, trachea midline and thyroid normal to inspection and palpation Lungs: clear to auscultation bilaterally Cardiovascular: regular rate and rhythm Breasts: normal appearance,  no masses or tenderness Abdomen: soft, non-tender; non distended,  no masses,  no organomegaly Extremities: extremities normal, atraumatic, no cyanosis or edema Skin: Skin color, texture, turgor normal. No rashes or lesions Lymph nodes: Cervical, supraclavicular, and axillary nodes normal. No abnormal inguinal nodes palpated Neurologic: Grossly normal   Pelvic: External genitalia:  no lesions              Urethra:  normal appearing urethra with no masses, tenderness or lesions              Bartholins and Skenes: normal                 Vagina: normal appearing vagina with normal color and discharge, no lesions              Cervix: absent               Bimanual Exam:  Uterus:  uterus absent              Adnexa: no mass, fullness, tenderness               Rectovaginal: Confirms               Anus:  normal sphincter tone, no lesions  Gae Dry, CMA chaperoned for the exam.  1. Well woman exam No pap this year Mammogram UTD Colonoscopy UTD Labs UTD Discussed breast self exam Discussed calcium and vit D intake

## 2023-01-30 ENCOUNTER — Ambulatory Visit (INDEPENDENT_AMBULATORY_CARE_PROVIDER_SITE_OTHER): Payer: Commercial Managed Care - PPO | Admitting: Obstetrics and Gynecology

## 2023-01-30 ENCOUNTER — Encounter: Payer: Self-pay | Admitting: Obstetrics and Gynecology

## 2023-01-30 VITALS — BP 110/70 | HR 68 | Ht 59.5 in | Wt 142.0 lb

## 2023-01-30 DIAGNOSIS — Z01419 Encounter for gynecological examination (general) (routine) without abnormal findings: Secondary | ICD-10-CM | POA: Diagnosis not present

## 2023-01-30 NOTE — Patient Instructions (Signed)

## 2023-02-23 ENCOUNTER — Other Ambulatory Visit: Payer: Self-pay | Admitting: Physician Assistant

## 2023-06-01 ENCOUNTER — Other Ambulatory Visit: Payer: Self-pay | Admitting: Physician Assistant

## 2023-07-24 ENCOUNTER — Encounter: Payer: Self-pay | Admitting: Physician Assistant

## 2023-07-24 ENCOUNTER — Ambulatory Visit (INDEPENDENT_AMBULATORY_CARE_PROVIDER_SITE_OTHER): Payer: Commercial Managed Care - PPO | Admitting: Physician Assistant

## 2023-07-24 VITALS — BP 134/88 | HR 82 | Temp 98.0°F | Ht 59.0 in | Wt 142.0 lb

## 2023-07-24 DIAGNOSIS — R29898 Other symptoms and signs involving the musculoskeletal system: Secondary | ICD-10-CM

## 2023-07-24 DIAGNOSIS — R002 Palpitations: Secondary | ICD-10-CM

## 2023-07-24 DIAGNOSIS — E663 Overweight: Secondary | ICD-10-CM

## 2023-07-24 DIAGNOSIS — R030 Elevated blood-pressure reading, without diagnosis of hypertension: Secondary | ICD-10-CM

## 2023-07-24 NOTE — Patient Instructions (Addendum)
It was great to see you!  Goal: Recommend moderate walking, 3-5 times/week for 30-50 minutes each session. Aim for at least 150 minutes.week. Goal should be pace of 3 miles/hours, or walking 1.5 miles in 30 minutes  If BP consistently > 140/90, and/or if palpitations return please call me!  I will place referral for physical therapy at [facility] inside of [facility]  I will also place referral for nutritionist.  Take care,  Jarold Motto PA-C

## 2023-07-24 NOTE — Progress Notes (Signed)
[name] is a 52 y.o. female here for a new problem.  History of Present Illness:   Chief Complaint  Patient presents with   Palpitations    Pt c/o Palpitations for a couple of months. Pt believes Amlodipine is causing palpitations so she stopped it 3 days ago, which stopped sx.    Weight Loss    Pt would like to discuss weight loss and dieting.     HPI  Palpitations/Hypertension: Reports palpitation have been occurring for the past couple of months. Notes palpitations occurred frequently, about 3-4 times within an hour and were so severe she would begin coughing.  She believes palpitation may be attributed to her 5 mg Amlodipine, so she stopped taking it 3 days ago. Notes palpitations have since stopped since she halted medication.  Also reports that she has recently been using magnesium powder and herbal detox tea.   Monitors blood pressure at home with an average of 140-150/85-90.  States she drinks one cup of coffee in the morning and mostly drinks water and eats healthy overall.  Denies chest pain, blurred vision, dizziness, unusual headaches, or lower leg swelling.  BP Readings from Last 3 Encounters:  07/24/23 134/88  01/30/23 110/70  11/03/22 (!) 142/92   Weight Loss: Reports that she would like to lose 20 pounds because she feels like her body is inflamed, possibly attributed to menopausal onset.  States she has been briskly walking 30 minutes daily since she went through PT, has recently began strength training. Her diet is overall healthy and she does not consume excessive sugar.      Past Medical History:  Diagnosis Date   Anemia    Asthma    Cesarean delivery delivered    DIVERTICULITIS, HX OF 12/24/2007   DIVERTICULOSIS-COLON 07/21/2009   Hematuria, microscopic 10/26/2017   Chronic, since teens; never cystoscopy.   Irritable bowel syndrome 07/21/2009   Kidney stones    Migraine    Seasonal allergies    URINARY INCONTINENCE 06/15/2008     Social  History   Tobacco Use   Smoking status: Former    Types: Cigarettes   Smokeless tobacco: Never   Tobacco comments:    smoked socially in college  Vaping Use   Vaping status: Never Used  Substance Use Topics   Alcohol use: Yes    Comment: glass of wine x 2 week   Drug use: No    Past Surgical History:  Procedure Laterality Date   CESAREAN SECTION     COLONOSCOPY  04/19/2009   diverticulosis    G2 P2     1 c-section, 1 NSVD   LEAP  98   rectocele/cystocele repair  06/2009   right fibula fx     age 87   TOTAL VAGINAL HYSTERECTOMY  06/2009   has her ovaries, and SSF, sling    Family History  Problem Relation Age of Onset   Hyperlipidemia Mother    Hypertension Mother    Colon polyps Mother    Peripheral Artery Disease Mother    Diabetes Maternal Grandmother    Healthy Daughter    Healthy Daughter    Colon cancer Neg Hx    Asthma Neg Hx    Esophageal cancer Neg Hx    Stomach cancer Neg Hx    Pancreatic cancer Neg Hx    Liver disease Neg Hx     No Known Allergies  Current Medications:   Current Outpatient Medications:    ibuprofen (ADVIL) 200 MG  tablet, Take 400 mg by mouth every 6 (six) hours as needed. (Patient not taking: Reported on 07/24/2023), Disp: , Rfl:    meloxicam (MOBIC) 7.5 MG tablet, TAKE 1 TABLET BY MOUTH EVERY DAY (Patient not taking: Reported on 07/24/2023), Disp: 30 tablet, Rfl: 0 No current facility-administered medications for this visit.  Facility-Administered Medications Ordered in Other Visits:    [COMPLETED] sodium chloride 0.9 % nebulizer solution 3 mL, 3 mL, Nebulization, Once, 3 mL at 12/27/11 1503 **FOLLOWED BY** [COMPLETED] methacholine (PROVOCHOLINE) inhaler solution 0.125 mg, 2 mL, Inhalation, Once, 0.125 mg at 12/27/11 1513 **FOLLOWED BY** [COMPLETED] methacholine (PROVOCHOLINE) inhaler solution 0.5 mg, 2 mL, Inhalation, Once, 0.5 mg at 12/27/11 1512 **FOLLOWED BY** methacholine (PROVOCHOLINE) inhaler solution 2 mg, 2 mL, Inhalation, Once  **FOLLOWED BY** methacholine (PROVOCHOLINE) inhaler solution 8 mg, 2 mL, Inhalation, Once **FOLLOWED BY** methacholine (PROVOCHOLINE) inhaler solution 32 mg, 2 mL, Inhalation, Once **FOLLOWED BY** albuterol (PROVENTIL) (5 MG/ML) 0.5% nebulizer solution 2.5 mg, 2.5 mg, Nebulization, Once, Kalman Shan, MD   Review of Systems:   Review of Systems  Respiratory:  Positive for cough (brought on by palpitations) and shortness of breath (walking up stairs).   Cardiovascular:  Positive for palpitations (see HPI).    Vitals:   Vitals:   07/24/23 1518  BP: 134/88  Pulse: 82  Temp: 98 F (36.7 C)  TempSrc: Temporal  SpO2: 98%  Weight: 142 lb (64.4 kg)  Height: 4\' 11"  (1.499 m)     Body mass index is 28.68 kg/m.  Physical Exam:   Physical Exam Vitals and nursing note reviewed.  Constitutional:      General: She is not in acute distress.    Appearance: She is well-developed. She is not ill-appearing or toxic-appearing.  Cardiovascular:     Rate and Rhythm: Normal rate and regular rhythm.     Pulses: Normal pulses.     Heart sounds: Normal heart sounds, S1 normal and S2 normal.  Pulmonary:     Effort: Pulmonary effort is normal.     Breath sounds: Normal breath sounds.  Skin:    General: Skin is warm and dry.  Neurological:     Mental Status: She is alert.     GCS: GCS eye subscore is 4. GCS verbal subscore is 5. GCS motor subscore is 6.  Psychiatric:        Speech: Speech normal.        Behavior: Behavior normal. Behavior is cooperative.     Assessment and Plan:   Elevated blood pressure reading Normotensive Will continue to hold blood pressure medication however Recommend that if home monitoring shows consistent elevation, or any symptom(s) develop, recommend reach out to [place] for further advice on next steps  Palpitations Declines work-up since symptom(s) have resolved since stopping amlodipine If returns, she will let us know and we will pursue additional further  work-up No red flag sx  Overweight; Muscular deconditioning Referral to Registered Dietitian and physical therapy   I,Emily Lagle,acting as a scribe for Jarold Motto, PA.,have documented all relevant documentation on the behalf of Jarold Motto, PA,as directed by  Jarold Motto, PA while in the presence of Jarold Motto, [place].  I, Jarold Motto, [place], have reviewed all documentation for this visit. The documentation on 07/24/23 for the exam, diagnosis, procedures, and orders are all accurate and complete.  Jarold Motto, PA-C

## 2023-08-17 ENCOUNTER — Ambulatory Visit: Payer: Commercial Managed Care - PPO | Admitting: Gastroenterology

## 2023-08-22 ENCOUNTER — Ambulatory Visit (HOSPITAL_BASED_OUTPATIENT_CLINIC_OR_DEPARTMENT_OTHER): Payer: Commercial Managed Care - PPO | Attending: Physician Assistant | Admitting: Physical Therapy

## 2023-08-22 DIAGNOSIS — M79605 Pain in left leg: Secondary | ICD-10-CM | POA: Diagnosis not present

## 2023-08-22 DIAGNOSIS — E663 Overweight: Secondary | ICD-10-CM | POA: Diagnosis not present

## 2023-08-22 DIAGNOSIS — R29898 Other symptoms and signs involving the musculoskeletal system: Secondary | ICD-10-CM | POA: Diagnosis not present

## 2023-08-22 DIAGNOSIS — M6281 Muscle weakness (generalized): Secondary | ICD-10-CM | POA: Diagnosis present

## 2023-08-22 DIAGNOSIS — M79604 Pain in right leg: Secondary | ICD-10-CM | POA: Insufficient documentation

## 2023-08-23 ENCOUNTER — Encounter (HOSPITAL_BASED_OUTPATIENT_CLINIC_OR_DEPARTMENT_OTHER): Payer: Self-pay | Admitting: Physical Therapy

## 2023-08-23 ENCOUNTER — Other Ambulatory Visit: Payer: Self-pay

## 2023-08-29 ENCOUNTER — Ambulatory Visit (HOSPITAL_BASED_OUTPATIENT_CLINIC_OR_DEPARTMENT_OTHER): Payer: Commercial Managed Care - PPO | Attending: Physician Assistant | Admitting: Physical Therapy

## 2023-08-29 DIAGNOSIS — M79604 Pain in right leg: Secondary | ICD-10-CM | POA: Insufficient documentation

## 2023-08-29 DIAGNOSIS — M6281 Muscle weakness (generalized): Secondary | ICD-10-CM | POA: Insufficient documentation

## 2023-08-29 DIAGNOSIS — M79605 Pain in left leg: Secondary | ICD-10-CM | POA: Insufficient documentation

## 2023-08-29 NOTE — Therapy (Signed)
OUTPATIENT PHYSICAL THERAPY TREATMENT NOTE   Patient Name: [name] MRN: [id number] DOB:December 16, 1970, 52 y.o., female Today's Date: 08/30/2023  END OF SESSION:  PT End of Session - 08/29/23      Visit Number 2    Number of Visits 6    Date for PT Re-Evaluation 10/03/23    Authorization Type [employer] UMR    PT Start Time 1115    PT Stop Time 1156   PT Time Calculation (min) 41 min    Activity Tolerance Patient tolerated treatment well    Behavior During Therapy WFL for tasks assessed/performed               Past Medical History:  Diagnosis Date   Anemia    Asthma    Cesarean delivery delivered    DIVERTICULITIS, HX OF 12/24/2007   DIVERTICULOSIS-COLON 07/21/2009   Hematuria, microscopic 10/26/2017   Chronic, since teens; never cystoscopy.   Irritable bowel syndrome 07/21/2009   Kidney stones    Migraine    Seasonal allergies    URINARY INCONTINENCE 06/15/2008   Past Surgical History:  Procedure Laterality Date   CESAREAN SECTION     COLONOSCOPY  04/19/2009   diverticulosis    G2 P2     1 c-section, 1 NSVD   LEAP  98   rectocele/cystocele repair  06/2009   right fibula fx     age 62   TOTAL VAGINAL HYSTERECTOMY  06/2009   has her ovaries, and SSF, sling   Patient Active Problem List   Diagnosis Date Noted   History of diverticulitis 05/13/2019   Hematuria, microscopic 10/26/2017   Mild intermittent asthma 01/29/2012   Allergic rhinitis 09/15/2011   Irritable bowel syndrome 07/21/2009    PCP: Jarold Motto, PA  REFERRING PROVIDER: Jarold Motto, PA  REFERRING DIAG: [phone] (ICD-10-CM) - Overweight  R29.898 (ICD-10-CM) - Muscular deconditioning  THERAPY DIAG:  Muscle weakness (generalized)  Pain in left leg  Pain in right leg  Rationale for Evaluation and Treatment: Rehabilitation  ONSET DATE: PT order 07/24/2023  SUBJECTIVE:   SUBJECTIVE STATEMENT: Pt states she started taking MCT powder for the past 2 weeks.  She feels she has more  energy and stairs are easier to perform.  Pt denies pain currently.  Pt denies any adverse effects after prior Rx.  Pt states her hips are really stiff when she first stands up.     PERTINENT HISTORY: Pelvic reconstruction for bladder prolapse approx 14 years ago  PAIN:  Location:  Hips and entire bilat LE's NPRS:  0/10 current and 6-7/10 worst  PRECAUTIONS: None  RED FLAGS: None   WEIGHT BEARING RESTRICTIONS: No  FALLS:  Has patient fallen in last 6 months? No  LIVING ENVIRONMENT: Lives with: lives with their family Lives in 2 story home. 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  PLOF: Independent  PATIENT GOALS: lose weight, gain muscle to reduce hip and LE pain   OBJECTIVE:   DIAGNOSTIC FINDINGS:  X rays in 2023: IMPRESSION: 1. No significant abnormality in the lumbar spine. 2. Mild degenerative changes at the right hip.    TODAY'S TREATMENT:  Pt performed:  Elliptical L1 x 5 mins  Cybex Leg press 50# x15, 70# 2x10  Lateral band walks with RTB around ankles 2x10  Supine bridge x10, with GTB around thighs 2x10  S/L clamshells with RTB 2x10 bilat  Runner's step up on 6 inch step 2x10 bilat   Pt received a HEP handout and was educated in correct form and appropriate frequency.    See below for pt education.  PATIENT EDUCATION:  Education details: PT answered pt's questions.  rationale of exercises, POC, Dx, relevant anatomy, prognosis, and HEP.   Person educated: Patient Education method: Explanation, demonstration, verbal cues, handout Education comprehension: verbalized understanding and needs further education, returned demonstration, verbal cues required  HOME EXERCISE PROGRAM: Access Code: [email or web address] URL: [email or web address] Date: 08/29/2023 Prepared by: Aaron Edelman  Exercises - Side Stepping with Resistance at Ankles  - 1 x daily - 3-4 x weekly - 2 sets - 10 reps - Clamshell with Resistance  - 1 x daily - 4 x weekly - 2 sets - 10 reps - Supine Bridge with Resistance Band  - 1 x daily - 5-6 x weekly - 2 sets - 10 reps - Runner's Step Up/Down  - 1 x daily - 5 x weekly - 2 sets - 10 reps  ASSESSMENT:  CLINICAL IMPRESSION: PT established HEP today.  Pt performed exercises well with cuing and instruction in correct form.  Pt tolerated exercises well.  She responded well to Rx having no pain and no c/o's after Rx.  She should benefit from continued skilled PT services to address goals and impairments and improve overall function.     OBJECTIVE IMPAIRMENTS: decreased activity tolerance, decreased strength, and pain.   ACTIVITY LIMITATIONS: sitting, sleeping, and stairs  PARTICIPATION LIMITATIONS: community activity and occupation  PERSONAL FACTORS: Time since onset of injury/illness/exacerbation are also affecting patient's functional outcome.   REHAB POTENTIAL: Good  CLINICAL DECISION MAKING: Stable/uncomplicated  EVALUATION COMPLEXITY: Low   GOALS:   SHORT TERM GOALS: Target date: 09/12/2023  Pt will tolerate HEP and gym exercises without adverse effects for improved strength and to establish a home/gym program.  Baseline: Goal status: INITIAL  2.  Pt will report at least a 25% improvement in pain and sx's overall including pain at night.  Baseline:  Goal status: INITIAL    LONG TERM GOALS: Target date: 10/03/2023  Pt will be independent with HEP/gym program for improved pain, strength, function and to establish a consistent workout routine.   Baseline:  Goal status: INITIAL  2.  Pt will demo improved strength to 5/5 MMT in bilat hips for improved tolerance to activity and performance of functional mobility including stairs.   Baseline:  Goal status: INITIAL  3.  Pt will report she is able to perform stairs without  difficulty.  Baseline:  Goal status: INITIAL  4.  Pt will report at least a 70% improvement in pain overall.   Baseline:  Goal status: INITIAL     PLAN:  PT FREQUENCY: 1x/week  PT DURATION: 4-6 weeks  PLANNED INTERVENTIONS: Therapeutic exercises, Therapeutic activity, Neuromuscular re-education, Balance training, Gait training, Patient/Family education, Self Care, Joint mobilization, Stair training, Aquatic Therapy, Dry Needling, Electrical stimulation, Cryotherapy, Moist heat, Taping, Ultrasound, Manual therapy, and Re-evaluation  PLAN FOR NEXT SESSION:  review and perform HEP.  Cont with LE strengthening. Educate pt concerning gym exercises.     Audie Clear III PT, DPT 08/30/23 10:15 PM

## 2023-08-30 ENCOUNTER — Encounter (HOSPITAL_BASED_OUTPATIENT_CLINIC_OR_DEPARTMENT_OTHER): Payer: Self-pay | Admitting: Physical Therapy

## 2023-09-03 ENCOUNTER — Encounter: Payer: Self-pay | Admitting: Family Medicine

## 2023-09-03 ENCOUNTER — Ambulatory Visit: Payer: Commercial Managed Care - PPO | Admitting: Family Medicine

## 2023-09-03 ENCOUNTER — Telehealth: Payer: Self-pay | Admitting: Physician Assistant

## 2023-09-03 VITALS — BP 130/88 | HR 78 | Temp 98.0°F | Ht 59.0 in | Wt 140.4 lb

## 2023-09-03 DIAGNOSIS — K5792 Diverticulitis of intestine, part unspecified, without perforation or abscess without bleeding: Secondary | ICD-10-CM | POA: Diagnosis not present

## 2023-09-03 MED ORDER — AMOXICILLIN-POT CLAVULANATE 875-125 MG PO TABS: 1.0000 | ORAL_TABLET | Freq: Two times a day (BID) | ORAL | 0 refills | Status: AC

## 2023-09-03 NOTE — Progress Notes (Signed)
   Subjective:    Patient ID: [name], female    DOB: 1971-05-14, 52 y.o.   MRN: [id number]  HPI Abd pain- pt has hx of diverticulitis.  Sxs started ~2 days ago.  Pt reports having granola prior to onset of sxs.  + LLQ pain.  + bloating.  + constipation.  No visible blood.  No N/V.  Feels similar to previous episodes.  Last episode requiring medical attention was 2 yrs ago and she was tx'd w/ Augmentin.  Pt reports she is often able to manage episodes by 'not eating'.  Reports feeling better today than yesterday but still has pain.   Review of Systems For ROS see HPI     Objective:   Physical Exam Vitals reviewed.  Constitutional:      General: She is not in acute distress.    Appearance: Normal appearance. She is not ill-appearing.  HENT:     Head: Normocephalic and atraumatic.  Cardiovascular:     Rate and Rhythm: Normal rate and regular rhythm.  Pulmonary:     Effort: Pulmonary effort is normal. No respiratory distress.  Abdominal:     General: There is no distension.     Palpations: Abdomen is soft.     Tenderness: There is abdominal tenderness (LLQ). There is no guarding or rebound.     Comments: Hypoactive bowel sounds  Skin:    General: Skin is warm and dry.  Neurological:     General: No focal deficit present.     Mental Status: She is alert and oriented to person, place, and time.  Psychiatric:        Mood and Affect: Mood normal.        Behavior: Behavior normal.        Thought Content: Thought content normal.           Assessment & Plan:  Diverticulitis- new.  Pt has hx of similar- last occurring 2 yrs ago.  Was tx'd w/ Augmentin by ER.  Has GI appt upcoming in November.  She indicates this is how previous episodes have felt.  Will tx w/ Augmentin, liquid diet.  Reviewed supportive care and red flags that should prompt return.  Pt expressed understanding and is in agreement w/ plan.

## 2023-09-03 NOTE — Telephone Encounter (Signed)
See Triage note, pt going to the ED.

## 2023-09-03 NOTE — Telephone Encounter (Signed)
Advised to go to ED. Pt stated would go to [facility] but do not see any chart notes.   Patient Name First: [facility] Last: Whitehead Gender: Female DOB: [birth year] Age: 52 Y 2 M 26 D Return Phone Number: [phone] (Primary) Address: City/ State/ Zip: [place]  [address] Client [facility] - Human resources officer [facility] [employer] Jarold Motto- [place] Contact Type Call Who Is Calling Patient / Member / Family / Caregiver Call Type Triage / Clinical Relationship To Patient Self Return Phone Number [phone] (Primary) Chief Complaint SEVERE ABDOMINAL PAIN - Severe pain in abdomen Reason for Call Symptomatic / Request for Health Information Initial Comment The caller states she has abd pain she has diverticulitis and she thinks she has infection causing the pain. Translation No Nurse Assessment Nurse: Dorinda Hill, RN, Asher Muir Date/Time (Eastern Time): 09/02/2023 4:04:25 PM Confirm and document reason for call. If symptomatic, describe symptoms. ---Caller states she has diverticulitis and is having mid lower ABD severe pain. She thinks it may be an infection. She says she feels slightly constipated for the last 3 days. She has no the Sx. Does the patient have any new or worsening symptoms? ---Yes Will a triage be completed? ---Yes Related visit to physician within the last 2 weeks? ---No Does the PT have any chronic conditions? (i.e. diabetes, asthma, this includes High risk factors for pregnancy, etc.) ---Yes List chronic conditions. ---Diverticulitis Is the patient pregnant or possibly pregnant? (Ask all females between the ages of 71-55) ---No Is this a behavioral health or substance abuse call? ---No Guidelines Guideline Title Affirmed Question Affirmed Notes Nurse Date/Time Lamount Cohen Time) Abdominal Pain - Female [1] SEVERE pain (e.g., excruciating) Dorinda Hill, RN, Asher Muir 09/02/2023  4:06:16 PM Guidelines Guideline Title Affirmed Question Affirmed Notes Nurse Date/Time (Eastern Time) AND [2] present > 1 hour Disp. Time Lamount Cohen Time) Disposition Final User 09/02/2023 4:02:58 PM Send to Urgent Queue Back, Jomarie Longs 09/02/2023 4:08:59 PM Go to ED Now Yes Dorinda Hill, RN, Asher Muir Final Disposition 09/02/2023 4:08:59 PM Go to ED Now Yes Dorinda Hill, RN, Romeo Rabon Disagree/Comply Comply Caller Understands Yes PreDisposition InappropriateToAsk Care Advice Given Per Guideline GO TO ED NOW: * You need to be seen in the Emergency Department. * Leave now. Drive carefully. * It is better and safer if another adult drives instead of you. CARE ADVICE given per Abdominal Pain - Female (Adult) guideline. Referrals  [facility] - ED

## 2023-09-03 NOTE — Patient Instructions (Signed)
Follow up as needed or as scheduled START the Augmentin twice daily Drink LOTS of fluids Clear diet until symptoms are improving and then advance to bland, easy to digest foods (bananas, mashed potatoes, etc) Call with any questions or concerns- particularly if symptoms change or worsen Hang in there!!!

## 2023-09-06 ENCOUNTER — Encounter (HOSPITAL_BASED_OUTPATIENT_CLINIC_OR_DEPARTMENT_OTHER): Payer: Self-pay

## 2023-09-06 ENCOUNTER — Encounter (HOSPITAL_BASED_OUTPATIENT_CLINIC_OR_DEPARTMENT_OTHER): Payer: Commercial Managed Care - PPO

## 2023-09-07 ENCOUNTER — Other Ambulatory Visit: Payer: Self-pay | Admitting: Physician Assistant

## 2023-09-13 ENCOUNTER — Ambulatory Visit (HOSPITAL_BASED_OUTPATIENT_CLINIC_OR_DEPARTMENT_OTHER): Payer: Commercial Managed Care - PPO | Admitting: Physical Therapy

## 2023-09-13 DIAGNOSIS — M6281 Muscle weakness (generalized): Secondary | ICD-10-CM | POA: Diagnosis not present

## 2023-09-13 DIAGNOSIS — M79605 Pain in left leg: Secondary | ICD-10-CM

## 2023-09-13 DIAGNOSIS — M79604 Pain in right leg: Secondary | ICD-10-CM

## 2023-09-13 NOTE — Therapy (Signed)
OUTPATIENT PHYSICAL THERAPY TREATMENT NOTE   Patient Name: [name] MRN: [id number] DOB:03/15/1971, 52 y.o., female Today's Date: 09/14/2023  END OF SESSION:  PT End of Session - 09/13/23      Visit Number 3   Number of Visits 6    Date for PT Re-Evaluation 10/03/23    Authorization Type [employer] UMR    PT Start Time 1408    PT Stop Time 1450   PT Time Calculation (min) 42 min    Activity Tolerance Patient tolerated treatment well    Behavior During Therapy WFL for tasks assessed/performed               Past Medical History:  Diagnosis Date   Anemia    Asthma    Cesarean delivery delivered    DIVERTICULITIS, HX OF 12/24/2007   DIVERTICULOSIS-COLON 07/21/2009   Hematuria, microscopic 10/26/2017   Chronic, since teens; never cystoscopy.   Irritable bowel syndrome 07/21/2009   Kidney stones    Migraine    Seasonal allergies    URINARY INCONTINENCE 06/15/2008   Past Surgical History:  Procedure Laterality Date   CESAREAN SECTION     COLONOSCOPY  04/19/2009   diverticulosis    G2 P2     1 c-section, 1 NSVD   LEAP  98   rectocele/cystocele repair  06/2009   right fibula fx     age 16   TOTAL VAGINAL HYSTERECTOMY  06/2009   has her ovaries, and SSF, sling   Patient Active Problem List   Diagnosis Date Noted   History of diverticulitis 05/13/2019   Hematuria, microscopic 10/26/2017   Mild intermittent asthma 01/29/2012   Allergic rhinitis 09/15/2011   Irritable bowel syndrome 07/21/2009    PCP: Jarold Motto, PA  REFERRING PROVIDER: Jarold Motto, PA  REFERRING DIAG: [phone] (ICD-10-CM) - Overweight  R29.898 (ICD-10-CM) - Muscular deconditioning  THERAPY DIAG:  Muscle weakness (generalized)  Pain in left leg  Pain in right leg  Rationale for Evaluation and Treatment: Rehabilitation  ONSET DATE: PT order 07/24/2023  SUBJECTIVE:   SUBJECTIVE STATEMENT: Pt had a flare up of diverticulitis last week and had to cancel last week.  She didn't  perform his exercises last week.  She was prescribed antibiotics and she feels so much better since taking the antibiotics.  Pt states she is having much less pain in her hips and legs since taking antibiotics.  Pt still on antibiotics now. Pt denies pain currently.  Pt denies any adverse effects after prior Rx.   PERTINENT HISTORY: Pelvic reconstruction for bladder prolapse approx 14 years ago  PAIN:  Location:  Hips and entire bilat LE's NPRS:  0/10 current and 6-7/10 worst  PRECAUTIONS: None  RED FLAGS: None   WEIGHT BEARING RESTRICTIONS: No  FALLS:  Has patient fallen in last 6 months? No  LIVING ENVIRONMENT: Lives with: lives with their family Lives in 2 story home. 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  PLOF: Independent  PATIENT GOALS: lose weight, gain muscle to reduce hip and LE pain   OBJECTIVE:   DIAGNOSTIC FINDINGS:  X rays in 2023: IMPRESSION: 1. No significant abnormality in the lumbar spine. 2. Mild degenerative changes at the right hip.    TODAY'S TREATMENT:  Pt performed:  Elliptical L1 x 5 mins  Life fitness Leg press 40# 3x10-12  Life fitness knee extension 15# bilat 2x12  Lateral band walks with RTB around ankles 2x10  Supine bridge with GTB around thighs 2x10, SL bridge x 10 each  S/L clamshells with RTB 2x10 bilat  Runner's step up on 6 inch step and 8 inch step x10 each bilat   Pt received a HEP handout and was educated in correct form and appropriate frequency.    See below for pt education.  PATIENT EDUCATION:  Education details: PT answered pt's questions.  rationale of exercises, POC, Dx, relevant anatomy, prognosis, and HEP.   Person educated: Patient Education method: Explanation, demonstration, verbal cues, handout Education comprehension: verbalized  understanding and needs further education, returned demonstration, verbal cues required  HOME EXERCISE PROGRAM: Access Code: [email or web address] URL: [email or web address] Date: 08/29/2023 Prepared by: Aaron Edelman  Exercises - Side Stepping with Resistance at Ankles  - 1 x daily - 3-4 x weekly - 2 sets - 10 reps - Clamshell with Resistance  - 1 x daily - 4 x weekly - 2 sets - 10 reps - Supine Bridge with Resistance Band  - 1 x daily - 5-6 x weekly - 2 sets - 10 reps - Runner's Step Up/Down  - 1 x daily - 5 x weekly - 2 sets - 10 reps  ASSESSMENT:  CLINICAL IMPRESSION: Pt is feeling much better since taking antibiotics due to her diverticulitis flare up.  She states her hips/legs and the entire area feel significantly better after taking antibiotics.  Progressed exercises today and pt tolerated progression well.  She performed exercises well with cuing and instruction in correct form.  She responded well to Rx having no pain and no c/o's after Rx.    OBJECTIVE IMPAIRMENTS: decreased activity tolerance, decreased strength, and pain.   ACTIVITY LIMITATIONS: sitting, sleeping, and stairs  PARTICIPATION LIMITATIONS: community activity and occupation  PERSONAL FACTORS: Time since onset of injury/illness/exacerbation are also affecting patient's functional outcome.   REHAB POTENTIAL: Good  CLINICAL DECISION MAKING: Stable/uncomplicated  EVALUATION COMPLEXITY: Low   GOALS:   SHORT TERM GOALS: Target date: 09/12/2023  Pt will tolerate HEP and gym exercises without adverse effects for improved strength and to establish a home/gym program.  Baseline: Goal status: INITIAL  2.  Pt will report at least a 25% improvement in pain and sx's overall including pain at night.  Baseline:  Goal status: INITIAL    LONG TERM GOALS: Target date: 10/03/2023  Pt will be independent with HEP/gym program for improved pain, strength, function and to establish a consistent workout routine.    Baseline:  Goal status: INITIAL  2.  Pt will demo improved strength to 5/5 MMT in bilat hips for improved tolerance to activity and performance of functional mobility including stairs.   Baseline:  Goal status: INITIAL  3.  Pt will report she is able to perform stairs without difficulty.  Baseline:  Goal status: INITIAL  4.  Pt will report at least a 70% improvement in pain overall.   Baseline:  Goal status: INITIAL     PLAN:  PT FREQUENCY: 1x/week  PT DURATION: 4-6 weeks  PLANNED INTERVENTIONS: Therapeutic exercises, Therapeutic activity, Neuromuscular re-education, Balance training, Gait training, Patient/Family education, Self Care, Joint mobilization, Stair training, Aquatic Therapy, Dry Needling, Electrical stimulation, Cryotherapy, Moist heat, Taping, Ultrasound, Manual therapy, and Re-evaluation  PLAN FOR NEXT SESSION:  review and perform HEP.  Cont with LE strengthening. Educate pt  concerning gym exercises.     Audie Clear III PT, DPT 09/15/23 7:24 AM

## 2023-09-14 ENCOUNTER — Encounter (HOSPITAL_BASED_OUTPATIENT_CLINIC_OR_DEPARTMENT_OTHER): Payer: Self-pay | Admitting: Physical Therapy

## 2023-09-19 ENCOUNTER — Ambulatory Visit (HOSPITAL_BASED_OUTPATIENT_CLINIC_OR_DEPARTMENT_OTHER): Payer: Commercial Managed Care - PPO | Admitting: Physical Therapy

## 2023-09-19 DIAGNOSIS — M6281 Muscle weakness (generalized): Secondary | ICD-10-CM | POA: Diagnosis not present

## 2023-09-19 DIAGNOSIS — M79605 Pain in left leg: Secondary | ICD-10-CM

## 2023-09-19 DIAGNOSIS — M79604 Pain in right leg: Secondary | ICD-10-CM

## 2023-09-19 NOTE — Therapy (Signed)
OUTPATIENT PHYSICAL THERAPY TREATMENT NOTE   Patient Name: [name] MRN: [id number] DOB:[birth year], 52 y.o., female Today's Date: 09/20/2023  END OF SESSION:  PT End of Session - 09/19/23      Visit Number 4   Number of Visits 5    Date for PT Re-Evaluation 10/03/23    Authorization Type [employer] UMR    PT Start Time 1024   PT Stop Time 1107   PT Time Calculation (min) 43 min    Activity Tolerance Patient tolerated treatment well    Behavior During Therapy WFL for tasks assessed/performed               Past Medical History:  Diagnosis Date   Anemia    Asthma    Cesarean delivery delivered    DIVERTICULITIS, HX OF 12/24/2007   DIVERTICULOSIS-COLON 07/21/2009   Hematuria, microscopic 10/26/2017   Chronic, since teens; never cystoscopy.   Irritable bowel syndrome 07/21/2009   Kidney stones    Migraine    Seasonal allergies    URINARY INCONTINENCE 06/15/2008   Past Surgical History:  Procedure Laterality Date   CESAREAN SECTION     COLONOSCOPY  04/19/2009   diverticulosis    G2 P2     1 c-section, 1 NSVD   LEAP  98   rectocele/cystocele repair  06/2009   right fibula fx     age 64   TOTAL VAGINAL HYSTERECTOMY  06/2009   has her ovaries, and SSF, sling   Patient Active Problem List   Diagnosis Date Noted   History of diverticulitis 05/13/2019   Hematuria, microscopic 10/26/2017   Mild intermittent asthma 01/29/2012   Allergic rhinitis 09/15/2011   Irritable bowel syndrome 07/21/2009    PCP: Jarold Motto, PA  REFERRING PROVIDER: Jarold Motto, PA  REFERRING DIAG: [phone] (ICD-10-CM) - Overweight  R29.898 (ICD-10-CM) - Muscular deconditioning  THERAPY DIAG:  Muscle weakness (generalized)  Pain in left leg  Pain in right leg  Rationale for Evaluation and Treatment: Rehabilitation  ONSET DATE: PT order 07/24/2023  SUBJECTIVE:   SUBJECTIVE STATEMENT: Pt states she is still feeling good since taking the antibiotics.  Pt reports 80-90%  improvement in pain and sx's overall.  She reports she is able to perform stairs without difficulty.   Pt denies pain currently.  Pt denies any adverse effects after prior Rx.   PERTINENT HISTORY: Pelvic reconstruction for bladder prolapse approx 14 years ago  PAIN:  Location:  Hips and entire bilat LE's NPRS:  0/10 current and 6-7/10 worst  PRECAUTIONS: None  RED FLAGS: None   WEIGHT BEARING RESTRICTIONS: No  FALLS:  Has patient fallen in last 6 months? No  LIVING ENVIRONMENT: Lives with: lives with their family Lives in 2 story home. 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  PLOF: Independent  PATIENT GOALS: lose weight, gain muscle to reduce hip and LE pain   OBJECTIVE:   DIAGNOSTIC FINDINGS:  X rays in 2023: IMPRESSION: 1. No significant abnormality in the lumbar spine. 2. Mild degenerative changes at the right hip.    TODAY'S TREATMENT:  Pt performed:  Elliptical L2 x 6 mins  Cybex Leg press 40#, 50#, 60#, 70# x 12 each  Life fitness knee extension 15# x12, 20# 2x12  Life fitness seated HS curl 30# 2x10  Lateral band walks with RTB x 10 and GTB around ankles 2x10  Supine SL bridge 3 x 10 each  S/L clamshells with RTB 2x10 bilat  Runner's step up on 8 inch step 2 x10 bilat  Wall sits 3x30 sec  Squats x 10 and with 10# x 10 reps  Pt received a HEP handout and was educated in correct form and appropriate frequency.    See below for pt education.  PATIENT EDUCATION:  Education details: PT answered pt's questions.  rationale of exercises, POC, Dx, relevant anatomy, prognosis, and HEP.   Person educated: Patient Education method: Explanation, demonstration, verbal cues, handout Education comprehension: verbalized understanding and needs further education, returned demonstration, verbal  cues required  HOME EXERCISE PROGRAM: Access Code: [email or web address] URL: [email or web address] Date: 08/29/2023 Prepared by: Aaron Edelman  Exercises - Side Stepping with Resistance at Ankles  - 1 x daily - 3-4 x weekly - 2 sets - 10 reps - Clamshell with Resistance  - 1 x daily - 4 x weekly - 2 sets - 10 reps - Supine Bridge with Resistance Band  - 1 x daily - 5-6 x weekly - 2 sets - 10 reps - Runner's Step Up/Down  - 1 x daily - 5 x weekly - 2 sets - 10 reps  ASSESSMENT:  CLINICAL IMPRESSION: Pt has made great progress.  Pt denies pain currently.  She reports feeling much better since taking antibiotics.  She reports 80-90% improvement in pain and sx's overall.  She is able to perform stairs without difficulty.  PT progressed intensity of exercises and pt tolerated progression well.  She performed exercises well without c/o's.  Pt has met all STG's and LTG's #3,4.  She responded well to Rx having no c/o's and no pain after Rx.  Pt should benefit from 1 more treatment to finalize HEP and establish independence with gym program.   OBJECTIVE IMPAIRMENTS: decreased activity tolerance, decreased strength, and pain.   ACTIVITY LIMITATIONS: sitting, sleeping, and stairs  PARTICIPATION LIMITATIONS: community activity and occupation  PERSONAL FACTORS: Time since onset of injury/illness/exacerbation are also affecting patient's functional outcome.   REHAB POTENTIAL: Good  CLINICAL DECISION MAKING: Stable/uncomplicated  EVALUATION COMPLEXITY: Low   GOALS:   SHORT TERM GOALS: Target date: 09/12/2023  Pt will tolerate HEP and gym exercises without adverse effects for improved strength and to establish a home/gym program.  Baseline: Goal status: GOAL MET  2.  Pt will report at least a 25% improvement in pain and sx's overall including pain at night.  Baseline:  Goal status:  GOAL MET    LONG TERM GOALS: Target date: 10/03/2023  Pt will be independent with HEP/gym program  for improved pain, strength, function and to establish a consistent workout routine.   Baseline:  Goal status: INITIAL  2.  Pt will demo improved strength to 5/5 MMT in bilat hips for improved tolerance to activity and performance of functional mobility including stairs.   Baseline:  Goal status: INITIAL  3.  Pt will report she is able to perform stairs without difficulty.  Baseline:  Goal status:GOAL MET  4.  Pt will report at least a 70% improvement in pain overall.   Baseline:  Goal status: GOAL MET     PLAN:  PT FREQUENCY: 1x/week  PT DURATION: 4-6 weeks  PLANNED INTERVENTIONS: Therapeutic exercises, Therapeutic activity, Neuromuscular re-education, Balance training, Gait training, Patient/Family education, Self Care, Joint mobilization, Stair training, Aquatic Therapy, Dry Needling, Electrical stimulation, Cryotherapy, Moist heat, Taping, Ultrasound, Manual therapy, and Re-evaluation  PLAN FOR NEXT SESSION:  Pt to be discharged from PT next visit.  Work on Psychologist, clinical and independence with gym exercises.    Audie Clear III PT, DPT 09/20/23 5:28 PM

## 2023-09-20 ENCOUNTER — Encounter (HOSPITAL_BASED_OUTPATIENT_CLINIC_OR_DEPARTMENT_OTHER): Payer: Self-pay | Admitting: Physical Therapy

## 2023-09-25 ENCOUNTER — Ambulatory Visit (HOSPITAL_BASED_OUTPATIENT_CLINIC_OR_DEPARTMENT_OTHER): Payer: Commercial Managed Care - PPO | Admitting: Physical Therapy

## 2023-09-25 DIAGNOSIS — M79605 Pain in left leg: Secondary | ICD-10-CM

## 2023-09-25 DIAGNOSIS — M79604 Pain in right leg: Secondary | ICD-10-CM

## 2023-09-25 DIAGNOSIS — M6281 Muscle weakness (generalized): Secondary | ICD-10-CM | POA: Diagnosis not present

## 2023-09-25 NOTE — Therapy (Signed)
OUTPATIENT PHYSICAL THERAPY TREATMENT NOTE   Patient Name: [name] MRN: [id number] DOB:[birth year], 52 y.o., female Today's Date: 09/26/2023  END OF SESSION:  PT End of Session - 09/25/23      Visit Number 5   Number of Visits 5    Date for PT Re-Evaluation 10/03/23    Authorization Type [employer] UMR    PT Start Time 0855   PT Stop Time 0939   PT Time Calculation (min) 44 min    Activity Tolerance Patient tolerated treatment well    Behavior During Therapy [facility] for tasks assessed/performed               Past Medical History:  Diagnosis Date   Anemia    Asthma    Cesarean delivery delivered    DIVERTICULITIS, HX OF 12/24/2007   DIVERTICULOSIS-COLON 07/21/2009   Hematuria, microscopic 10/26/2017   Chronic, since teens; never cystoscopy.   Irritable bowel syndrome 07/21/2009   Kidney stones    Migraine    Seasonal allergies    URINARY INCONTINENCE 06/15/2008   Past Surgical History:  Procedure Laterality Date   CESAREAN SECTION     COLONOSCOPY  04/19/2009   diverticulosis    G2 P2     1 c-section, 1 NSVD   LEAP  98   rectocele/cystocele repair  06/2009   right fibula fx     age 49   TOTAL VAGINAL HYSTERECTOMY  06/2009   has her ovaries, and SSF, sling   Patient Active Problem List   Diagnosis Date Noted   History of diverticulitis 05/13/2019   Hematuria, microscopic 10/26/2017   Mild intermittent asthma 01/29/2012   Allergic rhinitis 09/15/2011   Irritable bowel syndrome 07/21/2009    PCP: Jarold Motto, PA  REFERRING PROVIDER: Jarold Motto, PA  REFERRING DIAG: [phone] (ICD-10-CM) - Overweight  R29.898 (ICD-10-CM) - Muscular deconditioning  THERAPY DIAG:  Muscle weakness (generalized)  Pain in left leg  Pain in right leg  Rationale for Evaluation and Treatment: Rehabilitation  ONSET DATE: PT order 07/24/2023  SUBJECTIVE:   SUBJECTIVE STATEMENT: Pt states she is still a little stiff if she sits for awhile and gets up.  Pt states she  is still feeling good.   Pt has walked 30 mins 3 times since last Thursday.  Pt denies pain currently.  Pt denies any adverse effects after prior Rx.  Pt hasn't been performing her band exercises as much.   PERTINENT HISTORY: Pelvic reconstruction for bladder prolapse approx 14 years ago  PAIN:  Location:  Hips and entire bilat LE's NPRS:  0/10 current and 2-3/10 worst  PRECAUTIONS: None  RED FLAGS: None   WEIGHT BEARING RESTRICTIONS: No  FALLS:  Has patient fallen in last 6 months? No  LIVING ENVIRONMENT: Lives with: lives with their family Lives in 2 story home. 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  PLOF: Independent  PATIENT GOALS: lose weight, gain muscle to reduce hip and LE pain   OBJECTIVE:   DIAGNOSTIC FINDINGS:  X rays in 2023: IMPRESSION: 1. No significant abnormality in the lumbar spine. 2. Mild degenerative changes at the right hip.    TODAY'S TREATMENT:  Hip extension:  4+/5 bilat Hip abduction:  R: 5/5, L: 4+/5 Hip ER:  R:  4+/5, L: 4/5  Pt performed:  Elliptical L2 x 6 mins  Cybex Leg press  70# 3x10  Life fitness knee extension 15# x12, 20# 2x12  Life fitness seated HS curl 25# 2x10  Lateral band walks withGTB around ankles 3x10  Supine SL bridge 3 x 10 each  Wall sits 3x30 sec  Squats with 10# 3 x 10 reps  Pt received a HEP handout and was educated in correct form and appropriate frequency.    FOTO:  initial/current:  68/77.  Goal of 75.  See below for pt education.  PATIENT EDUCATION:  Education details: PT answered pt's questions.  rationale of exercises, POC, Dx, relevant anatomy, and HEP.   Person educated: Patient Education method: Explanation, demonstration, verbal cues, handout Education comprehension: verbalized understanding and needs further  education, returned demonstration, verbal cues required  HOME EXERCISE PROGRAM: Access Code: [email or web address] URL: [email or web address] Date: 08/29/2023 Prepared by: Aaron Edelman  Exercises - Side Stepping with Resistance at Ankles  - 1 x daily - 3-4 x weekly - 2 sets - 10 reps - Clamshell with Resistance  - 1 x daily - 4 x weekly - 2 sets - 10 reps - Supine Bridge with Resistance Band  - 1 x daily - 5-6 x weekly - 2 sets - 10 reps - Runner's Step Up/Down  - 1 x daily - 5 x weekly - 2 sets - 10 reps  Updated HEP:   - Figure 4 Bridge  - 1 x daily - 3 x weekly - 2-3 sets - 10 reps - Mini Squat  - 2 x weekly - 3 sets - 10 reps - Wall Sit  - 1 x daily - 4 x weekly - 3 reps - 30 seconds hold  ASSESSMENT:  CLINICAL IMPRESSION: Pt has made great progress.  Pt denies pain currently and reports her worst pain has improved 6-7/10 initially to 2-3/10.  Pt demonstrates improved hip abduction and extension strength and had no change in ER strength.  She is able to perform stairs without difficulty.  PT worked on finalizing her HEP and gym program.  PT educated pt in correct form with exercises, correct exercises, and correct positioning with gym exercises.  PT also educated pt in appropriate frequency of exercises and answered pt's questions.  She is independent with HEP and gym program.  Pt has met all goals except LTG #2.  Pt demonstrates improved self perceived disability as evidenced by FOTO score improvement.  She met her FOTO goal.  Pt is ready for discharge.  OBJECTIVE IMPAIRMENTS: decreased activity tolerance, decreased strength, and pain.   ACTIVITY LIMITATIONS: sitting, sleeping, and stairs  PARTICIPATION LIMITATIONS: community activity and occupation  PERSONAL FACTORS: Time since onset of injury/illness/exacerbation are also affecting patient's functional outcome.   REHAB POTENTIAL: Good  CLINICAL DECISION MAKING: Stable/uncomplicated  EVALUATION COMPLEXITY:  Low   GOALS:   SHORT TERM GOALS: Target date: 09/12/2023  Pt will tolerate HEP and gym exercises without adverse effects for improved strength and to establish a home/gym program.  Baseline: Goal status: GOAL MET  2.  Pt will report at least a 25% improvement in pain and sx's overall including pain at night.  Baseline:  Goal status:  GOAL MET    LONG TERM GOALS: Target date: 10/03/2023  Pt will be independent with HEP/gym program for improved pain, strength, function and to establish a consistent workout routine.  Baseline:  Goal status:  GOAL MET  2.  Pt will demo improved strength to 5/5 MMT in bilat hips for improved tolerance to activity and performance of functional mobility including stairs.   Baseline:  Goal status: IMPROVED BUT NOT MET  3.  Pt will report she is able to perform stairs without difficulty.  Baseline:  Goal status:GOAL MET  4.  Pt will report at least a 70% improvement in pain overall.   Baseline:  Goal status: GOAL MET     PLAN:  PLANNED INTERVENTIONS: Therapeutic exercises, Therapeutic activity, Neuromuscular re-education, Balance training, Gait training, Patient/Family education, Self Care, Joint mobilization, Stair training, Aquatic Therapy, Dry Needling, Electrical stimulation, Cryotherapy, Moist heat, Taping, Ultrasound, Manual therapy, and Re-evaluation  PLAN FOR NEXT SESSION:  Pt to be discharged from skilled PT services due to the meeting the majority of her goals.  She is independent with HEP and gym program and will cont with those programs.  Pt is agreeable with discharge.   PHYSICAL THERAPY DISCHARGE SUMMARY  Visits from Start of Care: 5  Current functional level related to goals / functional outcomes: See above   Remaining deficits: See above   Education / Equipment: HEP    Audie Clear III PT, DPT 09/26/23 5:28 PM

## 2023-09-26 ENCOUNTER — Encounter (HOSPITAL_BASED_OUTPATIENT_CLINIC_OR_DEPARTMENT_OTHER): Payer: Self-pay | Admitting: Physical Therapy

## 2023-10-15 ENCOUNTER — Encounter (HOSPITAL_BASED_OUTPATIENT_CLINIC_OR_DEPARTMENT_OTHER): Payer: Commercial Managed Care - PPO | Admitting: Physical Therapy

## 2023-10-16 ENCOUNTER — Ambulatory Visit: Payer: Commercial Managed Care - PPO | Admitting: Gastroenterology

## 2023-10-16 ENCOUNTER — Encounter: Payer: Self-pay | Admitting: Gastroenterology

## 2023-10-16 VITALS — BP 118/68 | HR 81 | Ht 59.5 in | Wt 143.0 lb

## 2023-10-16 DIAGNOSIS — K582 Mixed irritable bowel syndrome: Secondary | ICD-10-CM | POA: Diagnosis not present

## 2023-10-16 DIAGNOSIS — R1032 Left lower quadrant pain: Secondary | ICD-10-CM | POA: Diagnosis not present

## 2023-10-16 DIAGNOSIS — K5900 Constipation, unspecified: Secondary | ICD-10-CM

## 2023-10-16 MED ORDER — DICYCLOMINE HCL 10 MG PO CAPS: 10.0000 mg | ORAL_CAPSULE | Freq: Three times a day (TID) | ORAL | 11 refills | Status: DC | PRN

## 2023-10-16 NOTE — Progress Notes (Signed)
    Assessment     IBS with alternating bowel pattern Suspected rectocele  History of recurrent diverticulitis  Intermittently symptomatic external hemorrhoids    Recommendations    MiraLAX qd titrate dose for complete bowel movements qd-bid High-fiber diet with adequate daily water intake Dicyclomine 10 mg 3 times daily as needed abdominal pain GYN evaluation, suspected rectocele Rectal care instructions, Anusol HC cream twice daily as needed REV in 1 year with Dr. Doy Hutching   HPI    This is a 52 year old female with constipation alternating with diarrhea and frequent left lower quadrant abdominal pain.  She takes MiraLAX frequently however still has hard stools, difficulty evacuating stools on occasion.  She relates intermittently needing to put pressure on her rectum through her vagina to help with stool evacuation.  She has been treated for diverticulitis with recurrent left lower quadrant pain about twice a year over the past few years.  Additionally she notes rectal pain and focal swelling with extensive exercise.  She has used hemorrhoidal creams which improved symptoms.  Colonoscopy Nov 2021 - Mild diverticulosis in the right colon. There was no evidence of diverticular bleeding.  - Moderate diverticulosis in the left colon. There was no evidence of diverticular bleeding.  - Normal appearing terminal ileum.  - One 5 mm polyp in the rectum, removed with a cold snare. Resected and retrieved.  - Internal hemorrhoids.  - The examination was otherwise normal on direct and retroflexion views.  Path: Hyperplastic  Labs / Imaging       Latest Ref Rng & Units 07/27/2022    8:59 AM 08/26/2021   12:11 PM 05/13/2019    9:55 AM  Hepatic Function  Total Protein 6.0 - 8.3 g/dL 7.4  7.6  6.9   Albumin 3.5 - 5.2 g/dL 4.2  4.3  4.3   AST 0 - 37 U/L 19  18  16    ALT 0 - 35 U/L 18  20  17    Alk Phosphatase 39 - 117 U/L 71  83  70   Total Bilirubin 0.2 - 1.2 mg/dL 0.4  0.6  0.5         Latest Ref Rng & Units 07/27/2022    8:59 AM 08/26/2021   12:11 PM 05/13/2019    9:55 AM  CBC  WBC 4.0 - 10.5 K/uL 10.3  12.0  8.4   Hemoglobin 12.0 - 15.0 g/dL 60.4  54.0  98.1   Hematocrit 36.0 - 46.0 % 38.5  37.8  37.9   Platelets 150.0 - 400.0 K/uL 319.0  356  313.0    Current Medications, Allergies, Past Medical History, Past Surgical History, Family History and Social History were reviewed in [employer] record.   Physical Exam: General: Well developed, well nourished, no acute distress Head: Normocephalic and atraumatic Eyes: Sclerae anicteric, EOMI Ears: Normal auditory acuity Mouth: No deformities or lesions noted Lungs: Clear throughout to auscultation Heart: Regular rate and rhythm; No murmurs, rubs or bruits Abdomen: Soft, non tender and non distended. No masses, hepatosplenomegaly or hernias noted. Normal Bowel sounds Rectal: External hemorrhoids and tags, no internal lesions, no stool for Hemoccult  Musculoskeletal: Symmetrical with no gross deformities  Pulses:  Normal pulses noted Extremities: No edema or deformities noted Neurological: Alert oriented x 4, grossly nonfocal Psychological:  Alert and cooperative. Normal mood and affect   Chuong Casebeer T. Russella Dar, MD 10/16/2023, 10:44 AM

## 2023-10-16 NOTE — Patient Instructions (Signed)
We have sent the following medications to your pharmacy for you to pick up at your convenience: dicyclomine.   Increase your Miralax to have a full bowel movement daily.   Please use over the counter hydrocortisone cream as needed for hemorrhoid symptoms.   Follow up with your GYN physician.  Follow up with Dr. Doy Hutching as needed.  The [facility] providers would like to encourage you to use [facility] to communicate with providers for non-urgent requests or questions.  Due to long hold times on the telephone, sending your provider a message by [facility] may be a faster and more efficient way to get a response.  Please allow 48 business hours for a response.  Please remember that this is for non-urgent requests.   Thank you for choosing me and [facility].  Venita Lick. Pleas Koch., MD., Clementeen Graham

## 2023-10-31 ENCOUNTER — Encounter: Payer: Self-pay | Admitting: Dietician

## 2023-10-31 ENCOUNTER — Encounter: Payer: Commercial Managed Care - PPO | Attending: Physician Assistant | Admitting: Dietician

## 2023-10-31 VITALS — Wt 141.0 lb

## 2023-10-31 DIAGNOSIS — E663 Overweight: Secondary | ICD-10-CM | POA: Diagnosis present

## 2023-10-31 NOTE — Patient Instructions (Signed)
Goals Established by Pt Walk 4x per week Resistance training 2x per week Eat vegetables with lunch everyday Pack lunch on week days  Tips: check out your local tracks or trails General recommendations: Resistance training 2x per week and >150 minutes/wk of aerobic exercise, 64 oz water per day, 5 servings of fruits/vegetables per day

## 2023-10-31 NOTE — Progress Notes (Signed)
Medical Nutrition Therapy  Appointment Start time:  88  Appointment End time:  1147  Primary concerns today: high blood pressure and weight loss  Referral diagnosis: E66.3 Preferred learning style: no preference indicated Learning readiness: ready   NUTRITION ASSESSMENT   Anthropometrics  Wt: 141 lbs  Clinical Medical Hx: Anemia, asthma, diverticulosis, hypertension Medications: reviewed Labs: reviewed Notable Signs/Symptoms: joint pain Food Allergies: not reported  Lifestyle & Dietary Hx Pt reports having been diagnosed with high blood pressure, but has been treating through lifestyle changes.  Pt reports any previous lifestyle changes she has made have not resulted in weight loss. Pt reports she may be pre-menopausal and feels like this is contributing to her holding on to some weight.  Pt reports her family does not eat out often or eat a lot of red meat. Pt reports one of her daughters is pescatarian, so they have vegetarian meals often.  Pt reports she went through a divorce 8 years ago, and she was down to 118 lbs. Pt reports she was also running 3 miles every day. Pt reports current inflammation and pain in joints, hips & legs. Pt states she used to be a runner, but now goes to gym 2-3 per week and sometimes walks for exercise. Pt states she has noticed that her muscle mass has decreased in the last 5 years.   Pt reports about a year ago she went on a 3 mile hike with her family in [place]. Pt reports she was in extreme pain and barely able to get out the car. Pt reports she starting going to PT at that time.  Estimated daily fluid intake: 24-30 oz Supplements: Miralax every day Sleep: tossing and turning at night Stress / self-care: real-estate, high-stress job Current average weekly physical activity: gym 2-3 times per week, walking during summertime  24-Hr Dietary Recall First Meal:  most days skips, sometimes avocado toast with egg Snack:  none Second Meal: 12:00 PM [employer] grill grilled chicken with salad OR leftovers OR cheese and crackers Snack: none Third Meal: pasta/salad and chicken OR beef tacos Snack: sometimes cheese and crackers or string cheese Beverages: water, coffee  NUTRITION DIAGNOSIS  NB-1.1 Food and nutrition-related knowledge deficit As related to no prior nutrition education.  As evidenced by pt report.  NUTRITION INTERVENTION  Nutrition education (E-1) on the following topics:  Plate Method Fruits & Vegetables: Aim to fill half your plate with a variety of fruits and vegetables. They are rich in vitamins, minerals, and fiber, and can help reduce the risk of chronic diseases. Choose a colorful assortment of fruits and vegetables to ensure you get a wide range of nutrients. Grains and Starches: Make at least half of your grain choices whole grains, such as brown rice, whole wheat bread, and oats. Whole grains provide fiber, which aids in digestion and healthy cholesterol levels. Aim for whole forms of starchy vegetables such as potatoes, sweet potatoes, beans, peas, and corn, which are fiber rich and provide many vitamins and minerals.  Protein: Incorporate lean sources of protein, such as poultry, fish, beans, nuts, and seeds, into your meals. Protein is essential for building and repairing tissues, staying full, balancing blood sugar, as well as supporting immune function. Dairy: Include low-fat or fat-free dairy products like milk, yogurt, and cheese in your diet. Dairy foods are excellent sources of calcium and vitamin D, which are crucial for bone health.   Exercise Finding an exercise you enjoy is crucial for maintaining long-term fitness and  overall health. Enjoyable activities are more likely to become regular habits, making it easier to stay consistent with physical activity. When you look forward to your workouts, exercise becomes a positive experience rather than a chore, reducing the likelihood of  burnout or quitting. Enjoyable exercise also enhances mental well-being, as engaging in activities you love can boost mood, reduce stress, and provide a sense of accomplishment.  Increasing pace or switching up activity type Aim for 2-3x per week of resistance training  Macronutrient, Micronutrients and Calories Discussed macronutrient, micronutrients, calories and energy balance  Handouts Provided Include  Plate Method  Learning Style & Readiness for Change Teaching method utilized: Visual & Auditory  Demonstrated degree of understanding via: Teach Back  Barriers to learning/adherence to lifestyle change: none  Goals Established by Pt Walk 4x per week Resistance training 2x per week Eat vegetables with lunch everyday Pack lunch on week days  Tips: check out your local tracks or trails General recommendations: Resistance training 2x per week and >150 minutes/wk of aerobic exercise, 64 oz water per day, 5 servings of fruits/vegetables per day  MONITORING & EVALUATION Dietary intake, weekly physical activity, and follow up PRN.  Next Steps  Patient is to call for questions.

## 2023-11-26 ENCOUNTER — Encounter: Payer: Self-pay | Admitting: Family Medicine

## 2023-11-26 ENCOUNTER — Ambulatory Visit: Payer: Commercial Managed Care - PPO | Admitting: Family Medicine

## 2023-11-26 VITALS — BP 146/104 | HR 85 | Temp 98.9°F | Ht 59.5 in | Wt 144.2 lb

## 2023-11-26 DIAGNOSIS — B86 Scabies: Secondary | ICD-10-CM | POA: Diagnosis not present

## 2023-11-26 DIAGNOSIS — R030 Elevated blood-pressure reading, without diagnosis of hypertension: Secondary | ICD-10-CM | POA: Diagnosis not present

## 2023-11-26 MED ORDER — HYDROXYZINE PAMOATE 25 MG PO CAPS: 25.0000 mg | ORAL_CAPSULE | Freq: Three times a day (TID) | ORAL | 0 refills | Status: DC | PRN

## 2023-11-26 MED ORDER — PERMETHRIN 5 % EX CREA: 1.0000 | TOPICAL_CREAM | Freq: Once | CUTANEOUS | 1 refills | Status: AC

## 2023-11-26 MED ORDER — PERMETHRIN 5 % EX CREA: 1.0000 | TOPICAL_CREAM | Freq: Once | CUTANEOUS | 1 refills | Status: DC

## 2023-11-26 NOTE — Patient Instructions (Addendum)
Please follow up if symptoms do not improve or as needed.    VISIT SUMMARY:  During your visit, we discussed the itchy and painful spots on your hands and palms that appeared after your trip to [place]. We also reviewed your high blood pressure and the need for ongoing management.  YOUR PLAN:  -SCABIES: Scabies is a contagious skin condition caused by tiny mites that burrow into the skin, leading to intense itching and painful spots. You will need to apply the prescribed topical cream from head to toe overnight and wash it off after 8 hours. To prevent reinfestation, wash all bedding, towels, and clothing in hot water. For relief from itching, you can use topical Benadryl, Vistaril, and take oatmeal baths.  -HYPERTENSION: Hypertension, or high blood pressure, is a condition where the force of the blood against your artery walls is too high. Your blood pressure is currently very high, and it is important to follow up with your primary care physician to manage it effectively. Stress and recent itching may be contributing factors.  INSTRUCTIONS:  Please follow up with your primary care physician for blood pressure management. Monitor your symptoms and contact us if there are any changes or if you become sick.  Scabies, Adult  Scabies is a skin condition that happens when very small insects called mites get under your skin. This causes severe itchiness and a rash that looks like pimples. Scabies is contagious. This means it can spread easily from person to person. If you get scabies, the people you live with may get it too. With the right treatment, symptoms often go away in 2-4 weeks. In most cases, scabies does not cause lasting problems. What are the causes? Scabies is caused by tiny mites (Sarcoptes scabiei) that can only be seen with a microscope. The mites get into the top layer of your skin and lay eggs. This is called an infestation. You may get scabies if: You have close contact with  someone who has scabies. You come in contact with items that have the mites on them. These may include towels, bedding, or clothes. What increases the risk? You may be more likely to get scabies if: You live in a nursing home or extended care facility. You spend time in a place where a lot of people live close together, such as a shelter or prison. You have sex with a partner who has scabies. You care for others who are at risk for scabies. What are the signs or symptoms? Symptoms of scabies include: A rash that looks like pimples. It may include tiny red bumps or blisters. It is often found in the skinfolds or on the hands, wrists, elbows, armpits, chest, waist, groin, or buttocks. Severe itchiness. This is often worse at night. Skin irritation. This can include scaly patches or sores. The bumps from scabies may form a line (burrow) on the skin. The line may look thin, crooked, and grayish-white or skin colored. How is this diagnosed? Scabies may be diagnosed based on a physical exam of your skin. You may also have a skin test done. A sample of your skin may be taken (skin scraping) and looked at under a microscope for signs of mites. How is this treated? Scabies may be treated with: Medicated creams or lotions to kill the mites. The cream or lotion is spread on your whole body and left for a few hours. In most cases, one treatment is enough to kill all the mites. In severe cases, the  treatment may need to be done more than once. Medicated cream to help with the itching. Medicines taken by mouth (orally). These may help: Relieve itching. Reduce the swelling and redness. Kill the mites. This treatment may be used in severe cases. Follow these instructions at home: Medicines Take or apply over-the-counter and prescription medicines only as told by your health care provider. Apply medicated cream or lotion as told by your provider. Do not wash off the medicated cream or lotion until enough  time has passed or as told by your provider. Skin care Try not to scratch or pick at the affected areas of your skin. Keep your fingernails closely trimmed. This can help reduce injury from scratching. Take cool baths or apply cool, wet cloths to your skin. This can help reduce itching. General instructions Clean all items that you touched in the 3 days before you were diagnosed. This includes bedding, clothes, towels, and furniture. Do this on the same day that you start treatment. Dry-clean items or use hot water to wash them. Dry them on the hot dry cycle. Place items that cannot be washed into closed, airtight plastic bags for at least 3 days. The mites cannot live for more than 3 days away from human skin. Vacuum your furniture and mattresses. Make sure that other people who may have been infested see a provider. Where to find more information Centers for Disease Control and Prevention (CDC): [employer]no Contact a health care provider if: You have itching that does not go away after 4 weeks of treatment. You keep getting new bumps or burrows. You have redness, swelling, or pain near your rash after treatment. You have fluid, blood, or pus coming from your rash. You get thick crusts or scaly patches over large areas of your skin. You have a fever. This information is not intended to replace advice given to you by your health care provider. Make sure you discuss any questions you have with your health care provider. Document Revised: 08/21/2022 Document Reviewed: 08/21/2022 Elsevier Patient Education  2024 [employer].

## 2023-11-26 NOTE — Progress Notes (Signed)
Subjective  CC:  Chief Complaint  Patient presents with   Blister    Pt stated that she was in [place] for 5 days and she she came back home she noticed that she had blisters on her hands, both under arms, and elbow and also had swollen lymph nodes but they did go away    HPI: [name] is a 52 y.o. female who presents to the office today to address the problems listed above in the chief complaint. Discussed the use of AI scribe software for clinical note transcription with the patient, who gave verbal consent to proceed.  History of Present Illness   The patient, with past medical history of HTN no longer on meds who monitors bp at home and reports it has been stable, presents with a chief complaint of itchy, painful spots on her hands and palms. The spots first appeared on the twenty-fifth, after a trip to [place] where she extensively used public transportation. The spots initially present as hard little things and then worsen over time. The patient also reports extreme itchiness on her feet, similar to athlete's foot, which she treated with vinegar soaks and antifungal cream. The itchiness on the feet has since resolved. The patient also mentions a large lymph node that appeared before her trip to [place], which has since resolved. The patient denies any other symptoms, including sore throat or cold symptoms. The patient expresses significant distress and concern about her current condition. She thinks that is causing the high bp today in the office. No cp or sob. No sick contacts.       Assessment  1. Scabies   2. Elevated blood pressure reading      Plan  Assessment and Plan    Scabies - suspected Presents with intensely itchy and painful papules on the hands and palms, first noticed on November 21, 2023. Differential diagnosis includes scabies and erythema multiforme. Given the travel history and presentation, scabies is the most likely diagnosis. Discussed scabies as a  contagious mite infestation, typically affecting wrists, hands, and ankles. Explained that treatment involves a topical cream applied head-to-toe overnight, which kills the mites. Informed that the inflammatory response may persist for 1-2 weeks post-treatment. Emphasized the importance of washing all bedding, towels, and clothing in hot water to prevent reinfestation. Discussed that while scabies is contagious, it is not highly so, and typically only one person in a household is affected. - Prescribe topical scabies treatment cream to be applied overnight and washed off after 8 hours - Recommend washing all bedding, towels, and clothing in hot water - Advise using topical Benadryl and Vistaril for itching - Suggest oatmeal baths for soothing the skin  Hypertension Blood pressure is very high. She has been off antihypertensive medication. Stress and recent itching may be contributing factors. Previous medication caused palpitations, leading to discontinuation. Advised to follow up with primary care physician for blood pressure management. - Advise follow-up with primary care physician for blood pressure management  Follow-up - Follow up with primary care physician for blood pressure management - Monitor symptoms and contact the doctor if there are any changes or if she becomes sick.            No orders of the defined types were placed in this encounter.  Meds ordered this encounter  Medications   DISCONTD: permethrin (ELIMITE) 5 % cream    Sig: Apply 1 Application topically once for 1 dose. Repeat in 7 days if  needed    Dispense:  60 g    Refill:  1   DISCONTD: hydrOXYzine (VISTARIL) 25 MG capsule    Sig: Take 1 capsule (25 mg total) by mouth every 8 (eight) hours as needed.    Dispense:  30 capsule    Refill:  0   DISCONTD: hydrOXYzine (VISTARIL) 25 MG capsule    Sig: Take 1 capsule (25 mg total) by mouth every 8 (eight) hours as needed.    Dispense:  30 capsule    Refill:  0    DISCONTD: permethrin (ELIMITE) 5 % cream    Sig: Apply 1 Application topically once for 1 dose. Repeat in 7 days if needed    Dispense:  60 g    Refill:  1   hydrOXYzine (VISTARIL) 25 MG capsule    Sig: Take 1 capsule (25 mg total) by mouth every 8 (eight) hours as needed.    Dispense:  30 capsule    Refill:  0   permethrin (ELIMITE) 5 % cream    Sig: Apply 1 Application topically once for 1 dose. Repeat in 7 days if needed    Dispense:  60 g    Refill:  1     I reviewed the patients updated PMH, FH, and SocHx.    Patient Active Problem List   Diagnosis Date Noted   History of diverticulitis 05/13/2019   Hematuria, microscopic 10/26/2017   Mild intermittent asthma 01/29/2012   Allergic rhinitis 09/15/2011   Irritable bowel syndrome 07/21/2009   Current Meds  Medication Sig   [DISCONTINUED] hydrOXYzine (VISTARIL) 25 MG capsule Take 1 capsule (25 mg total) by mouth every 8 (eight) hours as needed.   [DISCONTINUED] permethrin (ELIMITE) 5 % cream Apply 1 Application topically once for 1 dose. Repeat in 7 days if needed    Allergies: Patient has no known allergies. Family History: Patient family history includes Colon polyps in her mother; Diabetes in her maternal grandmother; Healthy in her daughter and daughter; Hyperlipidemia in her mother; Hypertension in her mother; Other in her father; Peripheral Artery Disease in her mother. Social History:  Patient  reports that she has quit smoking. Her smoking use included cigarettes. She has never used smokeless tobacco. She reports current alcohol use. She reports that she does not use drugs.  Review of Systems: Constitutional: Negative for fever malaise or anorexia Cardiovascular: negative for chest pain Respiratory: negative for SOB or persistent cough Gastrointestinal: negative for abdominal pain  Objective  Vitals: BP (!) 146/104   Pulse 85   Temp 98.9 F (37.2 C)   Ht 4' 11.5" (1.511 m)   Wt 144 lb 3.2 oz (65.4 kg)   SpO2  96%   BMI 28.64 kg/m  General: no acute distress , A&Ox3 HEENT: PEERL, conjunctiva normal, neck is supple, no LAD Skin:  Warm, see photos: red papules on hands and elbow and upper arms  Commons side effects, risks, benefits, and alternatives for medications and treatment plan prescribed today were discussed, and the patient expressed understanding of the given instructions. Patient is instructed to call or message via MyChart if he/she has any questions or concerns regarding our treatment plan. No barriers to understanding were identified. We discussed Red Flag symptoms and signs in detail. Patient expressed understanding regarding what to do in case of urgent or emergency type symptoms.  Medication list was reconciled, printed and provided to the patient in AVS. Patient instructions and summary information was reviewed with the patient as documented in  the AVS. This note was prepared with assistance of Dragon voice recognition software. Occasional wrong-word or sound-a-like substitutions may have occurred due to the inherent limitations of voice recognition software

## 2023-12-17 ENCOUNTER — Telehealth: Payer: Commercial Managed Care - PPO | Admitting: Physician Assistant

## 2023-12-17 ENCOUNTER — Ambulatory Visit: Payer: Self-pay | Admitting: Physician Assistant

## 2023-12-17 DIAGNOSIS — M25552 Pain in left hip: Secondary | ICD-10-CM | POA: Diagnosis not present

## 2023-12-17 MED ORDER — METHYLPREDNISOLONE 4 MG PO TBPK: ORAL_TABLET | ORAL | 0 refills | Status: DC

## 2023-12-17 MED ORDER — CYCLOBENZAPRINE HCL 10 MG PO TABS: 5.0000 mg | ORAL_TABLET | Freq: Three times a day (TID) | ORAL | 0 refills | Status: DC | PRN

## 2023-12-17 NOTE — Progress Notes (Signed)
Virtual Visit Consent   [name], you are scheduled for a virtual visit with a [facility] provider today. Just as with appointments in the office, your consent must be obtained to participate. Your consent will be active for this visit and any virtual visit you may have with one of our providers in the next 365 days. If you have a MyChart account, a copy of this consent can be sent to you electronically.  As this is a virtual visit, video technology does not allow for your provider to perform a traditional examination. This may limit your provider's ability to fully assess your condition. If your provider identifies any concerns that need to be evaluated in person or the need to arrange testing (such as labs, EKG, etc.), we will make arrangements to do so. Although advances in technology are sophisticated, we cannot ensure that it will always work on either your end or our end. If the connection with a video visit is poor, the visit may have to be switched to a telephone visit. With either a video or telephone visit, we are not always able to ensure that we have a secure connection.  By engaging in this virtual visit, you consent to the provision of healthcare and authorize for your insurance to be billed (if applicable) for the services provided during this visit. Depending on your insurance coverage, you may receive a charge related to this service.  I need to obtain your verbal consent now. Are you willing to proceed with your visit today? [name] has provided verbal consent on 12/17/2023 for a virtual visit (video or telephone). Margaretann Loveless, PA-C  Date: 12/17/2023 10:09 AM  Virtual Visit via Video Note   I, Margaretann Loveless, connected with  [name]  ([id number], October 13, 1971) on 12/17/23 at 10:15 AM EST by a video-enabled telemedicine application and verified that I am speaking with the correct person using two identifiers.  Location: Patient: Virtual Visit  Location Patient: Home Provider: Virtual Visit Location Provider: Home Office   I discussed the limitations of evaluation and management by telemedicine and the availability of in person appointments. The patient expressed understanding and agreed to proceed.    History of Present Illness: [name] is a 53 y.o. who identifies as a female who was assigned female at birth, and is being seen today for left hip and leg pain.  HPI: Back Pain This is a new problem. Episode onset: Has had chronic hip pain over a year to two years, went to PT; in the past 3 days she has been in bed due to a URI. Now with pain in left hip that radiates into the left leg. Reports she is having trouble putting weight on her left leg and makes her cry. The problem occurs constantly. The problem is unchanged. The pain is present in the gluteal. The quality of the pain is described as burning, stabbing and shooting. The pain radiates to the left thigh and left knee. The pain is at a severity of 10/10 (when weight bearing). The pain is severe. The pain is Worse during the night. The symptoms are aggravated by standing and position. Associated symptoms include leg pain and weakness. Pertinent negatives include no bladder incontinence, bowel incontinence, numbness or tingling. She has tried NSAIDs and bed rest for the symptoms. The treatment provided no relief.     Problems:  Patient Active Problem List   Diagnosis Date Noted   History of diverticulitis 05/13/2019  Hematuria, microscopic 10/26/2017   Mild intermittent asthma 01/29/2012   Allergic rhinitis 09/15/2011   Irritable bowel syndrome 07/21/2009    Allergies: No Known Allergies Medications:  Current Outpatient Medications:    dicyclomine (BENTYL) 10 MG capsule, Take 1 capsule (10 mg total) by mouth 3 (three) times daily as needed for spasms., Disp: 90 capsule, Rfl: 11   hydrOXYzine (VISTARIL) 25 MG capsule, Take 1 capsule (25 mg total) by mouth every 8  (eight) hours as needed., Disp: 30 capsule, Rfl: 0 No current facility-administered medications for this visit.  Facility-Administered Medications Ordered in Other Visits:    [COMPLETED] sodium chloride 0.9 % nebulizer solution 3 mL, 3 mL, Nebulization, Once, 3 mL at 12/27/11 1503 **FOLLOWED BY** [COMPLETED] methacholine (PROVOCHOLINE) inhaler solution 0.125 mg, 2 mL, Inhalation, Once, 0.125 mg at 12/27/11 1513 **FOLLOWED BY** [COMPLETED] methacholine (PROVOCHOLINE) inhaler solution 0.5 mg, 2 mL, Inhalation, Once, 0.5 mg at 12/27/11 1512 **FOLLOWED BY** methacholine (PROVOCHOLINE) inhaler solution 2 mg, 2 mL, Inhalation, Once **FOLLOWED BY** methacholine (PROVOCHOLINE) inhaler solution 8 mg, 2 mL, Inhalation, Once **FOLLOWED BY** methacholine (PROVOCHOLINE) inhaler solution 32 mg, 2 mL, Inhalation, Once **FOLLOWED BY** albuterol (PROVENTIL) (5 MG/ML) 0.5% nebulizer solution 2.5 mg, 2.5 mg, Nebulization, Once, Kalman Shan, MD  Observations/Objective: Patient is well-developed, well-nourished in no acute distress.  Resting comfortably at home.  Head is normocephalic, atraumatic.  No labored breathing.  Speech is clear and coherent with logical content.  Patient is alert and oriented at baseline.    Assessment and Plan: There are no diagnoses linked to this encounter. - Severe left hip pain - Prescribed Medrol and Flexeril to try to ease pain - Advised to follow up with Dr. Denyse Amass since she ahs seen him for this in the past or with Orthopedic UC at [facility]; She voiced understanding  Follow Up Instructions: I discussed the assessment and treatment plan with the patient. The patient was provided an opportunity to ask questions and all were answered. The patient agreed with the plan and demonstrated an understanding of the instructions.  A copy of instructions were sent to the patient via MyChart unless otherwise noted below.    The patient was advised to call back or seek an in-person  evaluation if the symptoms worsen or if the condition fails to improve as anticipated.    Margaretann Loveless, PA-C

## 2023-12-17 NOTE — Telephone Encounter (Signed)
Copied from CRM [id number]. Topic: Clinical - Red Word Triage >> Dec 17, 2023  8:55 AM Kathryne Eriksson wrote: Red Word that prompted transfer to Nurse Triage: Severe Back Pain >> Dec 17, 2023  8:57 AM Kathryne Eriksson wrote: Patient states the pain is in her left hip and it's traveling down into her left leg, making it extremely hard and painful to walk. Patient states it's almost impossible to walk at this point.    Chief Complaint: 10/10 back pain Symptoms: left back pain that radiates to left leg and stops above knee - walking makes worse- only resting makes better Frequency: x 3 days Pertinent Negatives: Patient denies numbness and tingling Disposition: [] ED /[] Urgent Care (no appt availability in office) / [] Appointment(In office/virtual)/ [x]  [facility]/ [] Home Care/ [] Refused Recommended Disposition /[] [facility]/ []  Follow-up with PCP Additional Notes: pt taking tylenol without relief    LOCATION: "Where does it hurt?"     Leg  Back to left hip to leg before knee 2. RADIATION: "Does the pain shoot anywhere else?" (e.g., chest, back)     Yes down leg 3. ONSET: "When did the pain begin?" (e.g., minutes, hours or days ago)      X 3 days 4. SUDDEN: "Gradual or sudden onset?"     sudden 5. PATTERN "Does the pain come and go, or is it constant?"    - If it comes and goes: "How long does it last?" "Do you have pain now?"     (Note: Comes and goes means the pain is intermittent. It goes away completely between bouts.)    - If constant: "Is it getting better, staying the same, or getting worse?"      (Note: Constant means the pain never goes away completely; most serious pain is constant and gets worse.)      N/a 6. SEVERITY: "How bad is the pain?"  (e.g., Scale 1-10; mild, moderate, or severe)    - MILD (1-3): Doesn't interfere with normal activities, abdomen soft and not tender to touch.     - MODERATE (4-7): Interferes with normal activities or awakens from sleep,  abdomen tender to touch.     - SEVERE (8-10): Excruciating pain, doubled over, unable to do any normal activities.       N/a 7. RECURRENT SYMPTOM: "Have you ever had this type of stomach pain before?" If Yes, ask: "When was the last time?" and "What happened that time?"      N/a 8. CAUSE: "What do you think is causing the stomach pain?"     no 9. RELIEVING/AGGRAVATING FACTORS: "What makes it better or worse?" (e.g., antacids, bending or twisting motion, bowel movement)     Walking causes 10/10 pain 10. OTHER SYMPTOMS: "Do you have any other symptoms?" (e.g., back pain, diarrhea, fever, urination pain, vomiting)       Back pain,  11. PREGNANCY: "Is there any chance you are pregnant?" "When was your last menstrual period?"       N/a

## 2023-12-17 NOTE — Patient Instructions (Signed)
Darliss Ridgel, thank you for joining Margaretann Loveless, PA-C for today's virtual visit.  While this provider is not your primary care provider (PCP), if your PCP is located in our provider database this encounter information will be shared with them immediately following your visit.   A [facility] MyChart account gives you access to today's visit and all your visits, tests, and labs performed at [facility] " click here if you don't have a [facility] MyChart account or go to [email or web address]  Consent: (Patient) [name] provided verbal consent for this virtual visit at the beginning of the encounter.  Current Medications:  Current Outpatient Medications:    cyclobenzaprine (FLEXERIL) 10 MG tablet, Take 0.5-1 tablets (5-10 mg total) by mouth 3 (three) times daily as needed., Disp: 30 tablet, Rfl: 0   methylPREDNISolone (MEDROL DOSEPAK) 4 MG TBPK tablet, 6 day taper; take as directed instructions, Disp: 21 tablet, Rfl: 0   dicyclomine (BENTYL) 10 MG capsule, Take 1 capsule (10 mg total) by mouth 3 (three) times daily as needed for spasms., Disp: 90 capsule, Rfl: 11   hydrOXYzine (VISTARIL) 25 MG capsule, Take 1 capsule (25 mg total) by mouth every 8 (eight) hours as needed., Disp: 30 capsule, Rfl: 0 No current facility-administered medications for this visit.  Facility-Administered Medications Ordered in Other Visits:    [COMPLETED] sodium chloride 0.9 % nebulizer solution 3 mL, 3 mL, Nebulization, Once, 3 mL at 12/27/11 1503 **FOLLOWED BY** [COMPLETED] methacholine (PROVOCHOLINE) inhaler solution 0.125 mg, 2 mL, Inhalation, Once, 0.125 mg at 12/27/11 1513 **FOLLOWED BY** [COMPLETED] methacholine (PROVOCHOLINE) inhaler solution 0.5 mg, 2 mL, Inhalation, Once, 0.5 mg at 12/27/11 1512 **FOLLOWED BY** methacholine (PROVOCHOLINE) inhaler solution 2 mg, 2 mL, Inhalation, Once **FOLLOWED BY** methacholine (PROVOCHOLINE) inhaler solution 8 mg, 2 mL, Inhalation, Once  **FOLLOWED BY** methacholine (PROVOCHOLINE) inhaler solution 32 mg, 2 mL, Inhalation, Once **FOLLOWED BY** albuterol (PROVENTIL) (5 MG/ML) 0.5% nebulizer solution 2.5 mg, 2.5 mg, Nebulization, Once, Kalman Shan, MD   Medications ordered in this encounter:  Meds ordered this encounter  Medications   methylPREDNISolone (MEDROL DOSEPAK) 4 MG TBPK tablet    Sig: 6 day taper; take as directed instructions    Dispense:  21 tablet    Refill:  0    Supervising Provider:   Merrilee Jansky [[id number]]   cyclobenzaprine (FLEXERIL) 10 MG tablet    Sig: Take 0.5-1 tablets (5-10 mg total) by mouth 3 (three) times daily as needed.    Dispense:  30 tablet    Refill:  0    Supervising Provider:   Merrilee Jansky [[id number]]     *If you need refills on other medications prior to your next appointment, please contact your pharmacy*  Follow-Up: Call back or seek an in-person evaluation if the symptoms worsen or if the condition fails to improve as anticipated.  [facility] [phone]  Other Instructions  [facility] Location: [facility] [address], [address] [place], [place] [address] Phone: [phone] Convenient hours: Monday - Friday: 11 a.m. - 7 p.m. Visit our website to schedule an appointment online or walk-in during clinic hours. Your well-being is our priority. Move better with [place]!    If you have been instructed to have an in-person evaluation today at a local Urgent Care facility, please use the link below. It will take you to a list of all of our available [facility], including address, phone  number and hours of operation. Please do not delay care.  [facility]  If you or a family member do not have a primary care provider, use the link below to schedule a visit and establish care. When you choose a [facility] primary care physician or advanced practice provider, you  gain a long-term partner in health. Find a Primary Care Provider  Learn more about [facility]'s in-office and virtual care options: [facility] - Get Care Now

## 2023-12-17 NOTE — Telephone Encounter (Deleted)
Copied from CRM [id number]. Topic: Clinical - Red Word Triage >> Dec 17, 2023  8:55 AM Kathryne Eriksson wrote: Red Word that prompted transfer to Nurse Triage: Severe Back Pain >> Dec 17, 2023  8:57 AM Kathryne Eriksson wrote: Patient states the pain is in her left hip and it's traveling down into her left leg, making it extremely hard and painful to walk. Patient states it's almost impossible to walk at this point.   Chief Complaint: *** Symptoms: *** Frequency: *** Pertinent Negatives: Patient denies *** Disposition: [] ED /[] Urgent Care (no appt availability in office) / [] Appointment(In office/virtual)/ []  [facility]/ [] Home Care/ [] Refused Recommended Disposition /[] [facility]/ []  Follow-up with PCP Additional Notes: ***  Reason for Disposition . [1] MILD-MODERATE pain AND [2] constant AND [3] present > 2 hours . [1] SEVERE back pain (e.g., excruciating, unable to do any normal activities) AND [2] not improved 2 hours after pain medicine  Answer Assessment - Initial Assessment Questions 1. LOCATION: "Where does it hurt?"     Leg  Back to left hip to leg before knee 2. RADIATION: "Does the pain shoot anywhere else?" (e.g., chest, back)     Yes down leg 3. ONSET: "When did the pain begin?" (e.g., minutes, hours or days ago)      X 3 days 4. SUDDEN: "Gradual or sudden onset?"     sudden 5. PATTERN "Does the pain come and go, or is it constant?"    - If it comes and goes: "How long does it last?" "Do you have pain now?"     (Note: Comes and goes means the pain is intermittent. It goes away completely between bouts.)    - If constant: "Is it getting better, staying the same, or getting worse?"      (Note: Constant means the pain never goes away completely; most serious pain is constant and gets worse.)      N/a 6. SEVERITY: "How bad is the pain?"  (e.g., Scale 1-10; mild, moderate, or severe)    - MILD (1-3): Doesn't interfere with normal activities, abdomen soft and not  tender to touch.     - MODERATE (4-7): Interferes with normal activities or awakens from sleep, abdomen tender to touch.     - SEVERE (8-10): Excruciating pain, doubled over, unable to do any normal activities.       N/a 7. RECURRENT SYMPTOM: "Have you ever had this type of stomach pain before?" If Yes, ask: "When was the last time?" and "What happened that time?"      N/a 8. CAUSE: "What do you think is causing the stomach pain?"     no 9. RELIEVING/AGGRAVATING FACTORS: "What makes it better or worse?" (e.g., antacids, bending or twisting motion, bowel movement)     Walking causes 10/10 pain 10. OTHER SYMPTOMS: "Do you have any other symptoms?" (e.g., back pain, diarrhea, fever, urination pain, vomiting)       Back pain,  11. PREGNANCY: "Is there any chance you are pregnant?" "When was your last menstrual period?"       N/a  Answer Assessment - Initial Assessment Questions LOCATION: "Where does it hurt?"     Leg  Back to left hip to leg before knee 2. RADIATION: "Does the pain shoot anywhere else?" (e.g., chest, back)     Yes down leg 3. ONSET: "When did the pain begin?" (e.g., minutes, hours or days ago)      X 3 days 4. SUDDEN: "Gradual or  sudden onset?"     sudden 5. PATTERN "Does the pain come and go, or is it constant?"    - If it comes and goes: "How long does it last?" "Do you have pain now?"     (Note: Comes and goes means the pain is intermittent. It goes away completely between bouts.)    - If constant: "Is it getting better, staying the same, or getting worse?"      (Note: Constant means the pain never goes away completely; most serious pain is constant and gets worse.)      N/a 6. SEVERITY: "How bad is the pain?"  (e.g., Scale 1-10; mild, moderate, or severe)    - MILD (1-3): Doesn't interfere with normal activities, abdomen soft and not tender to touch.     - MODERATE (4-7): Interferes with normal activities or awakens from sleep, abdomen tender to touch.     - SEVERE  (8-10): Excruciating pain, doubled over, unable to do any normal activities.       N/a 7. RECURRENT SYMPTOM: "Have you ever had this type of stomach pain before?" If Yes, ask: "When was the last time?" and "What happened that time?"      N/a 8. CAUSE: "What do you think is causing the stomach pain?"     no 9. RELIEVING/AGGRAVATING FACTORS: "What makes it better or worse?" (e.g., antacids, bending or twisting motion, bowel movement)     Walking causes 10/10 pain 10. OTHER SYMPTOMS: "Do you have any other symptoms?" (e.g., back pain, diarrhea, fever, urination pain, vomiting)       Back pain,  11. PREGNANCY: "Is there any chance you are pregnant?" "When was your last menstrual period?"       N/a  Protocols used: Abdominal Pain - Female-A-AH, Back Pain-A-AH

## 2023-12-17 NOTE — Telephone Encounter (Signed)
FYI, see Triage note. Pt scheduled today. 

## 2023-12-19 ENCOUNTER — Ambulatory Visit (INDEPENDENT_AMBULATORY_CARE_PROVIDER_SITE_OTHER): Payer: Commercial Managed Care - PPO | Admitting: Family Medicine

## 2023-12-19 ENCOUNTER — Encounter: Payer: Self-pay | Admitting: Family Medicine

## 2023-12-19 ENCOUNTER — Ambulatory Visit (INDEPENDENT_AMBULATORY_CARE_PROVIDER_SITE_OTHER)
Admission: RE | Admit: 2023-12-19 | Discharge: 2023-12-19 | Disposition: A | Discharge status: Home / Self Care | Payer: Commercial Managed Care - PPO | Source: Ambulatory Visit | Attending: Family Medicine | Admitting: Family Medicine

## 2023-12-19 VITALS — BP 132/88 | HR 77 | Ht 59.5 in | Wt 136.0 lb

## 2023-12-19 DIAGNOSIS — M5416 Radiculopathy, lumbar region: Secondary | ICD-10-CM | POA: Diagnosis not present

## 2023-12-19 DIAGNOSIS — M25552 Pain in left hip: Secondary | ICD-10-CM | POA: Diagnosis not present

## 2023-12-19 NOTE — Progress Notes (Signed)
Low back x-ray shows some arthritis changes in the low back.  Additionally the way you were made is like you have an extra vertebrae in your back which can cause some back pain.  If we need to look at this more accurately we can with MRIs.

## 2023-12-19 NOTE — Patient Instructions (Addendum)
Thank you for coming in today.   Our x-ray machine is broken at the moment.  Please go our our [facility] office to get x-rays after your visit.  Continue chriopracter.   IF not improving we could retry PT and or get an MRI.   Ok to fill the medrol dose pack and flexeril. Ok to use if worsening or not improving.   Flexeril is helpful at night.  Marland Kitchen

## 2023-12-19 NOTE — Progress Notes (Signed)
Left hip x-ray shows some arthritis in the left hip.  No fractures are visible.

## 2023-12-19 NOTE — Progress Notes (Signed)
   Rubin Payor, PhD, LAT, ATC acting as a scribe for Clementeen Graham, MD.  [name] is a 53 y.o. female who presents to [facility] today for L hip pain. Pt was previously seen by Dr. Denyse Amass in 2023 for LBP, bilat hip, and bilat shoulder pain.  Today, pt c/o L hip pain ongoing since last week. Pain started when she was sick and resting in bed for 4 days. Pt locates pain to anterior aspect of her L hip, w/ radiating pain along the anterior thigh, stopping proximal to the knee.   She completed PT for her prior/chronic hip pain in October 2024 (about 3 months ago).   Low back pain: yes Radiating pain: yes LE numbness/tingling: no LE weakness: yes Aggravates: unsure Treatments tried: chiro, prior PT  Dx imaging: 08/11/23 L-spine XR  Pertinent review of systems: no fever or chills  Relevant historical information: asthma   Exam:  BP 132/88   Pulse 77   Ht 4' 11.5" (1.511 m)   Wt 136 lb (61.7 kg)   SpO2 98%   BMI 27.01 kg/m  General: Well Developed, well nourished, and in no acute distress.   MSK: L-spine: Normal appearing. Normal motion.  Lower extremity strength is intact.  Negative straight leg raise test.   Left hip: Normal appearing.  Normal motion.  Intact strength.  No pain with resisted hip flexion or abduction.     Lab and Radiology Results:   Xray images lumbar spine and left hip ordered today however the x-ray machine is not functional.  Will be done different location or later date.    Assessment and Plan: 53 y.o. female with left hip and thigh pain. This is a recurrence of prior chronic hip and low back pain.  She had severe pain a few days ago which is significantly improved with a few visits with her chiropractor.  She did have an electronic visit and was prescribed Medrol Dosepak and cyclobenzaprine but has not started taking these medications yet.  She does continue to have pain but overall is much improved today  than she was 2 days ago.   PDMP not reviewed this encounter. Orders Placed This Encounter  Procedures   DG HIP UNILAT W OR W/O PELVIS 2-3 VIEWS LEFT    Standing Status:   Future    Expiration Date:   01/19/2024    Reason for Exam (SYMPTOM  OR DIAGNOSIS REQUIRED):   left hip pain    Preferred imaging location?:   [address]    Is patient pregnant?:   No   DG Lumbar Spine 2-3 Views    Standing Status:   Future    Expiration Date:   01/19/2024    Reason for Exam (SYMPTOM  OR DIAGNOSIS REQUIRED):   lumbar radiculopathy    Preferred imaging location?:   [address]    Is patient pregnant?:   No   No orders of the defined types were placed in this encounter.    Discussed warning signs or symptoms. Please see discharge instructions. Patient expresses understanding.  The above documentation has been reviewed and is accurate and complete Clementeen Graham, M.D.

## 2024-01-10 LAB — HM MAMMOGRAPHY

## 2024-01-11 ENCOUNTER — Encounter: Payer: Self-pay | Admitting: Physician Assistant

## 2024-08-07 ENCOUNTER — Encounter: Payer: Self-pay | Admitting: Physician Assistant

## 2024-08-07 ENCOUNTER — Telehealth: Payer: Self-pay | Admitting: *Deleted

## 2024-08-07 ENCOUNTER — Ambulatory Visit: Admitting: Physician Assistant

## 2024-08-07 VITALS — BP 148/100 | HR 81 | Temp 97.7°F | Ht 59.5 in | Wt 141.0 lb

## 2024-08-07 DIAGNOSIS — T7840XA Allergy, unspecified, initial encounter: Secondary | ICD-10-CM

## 2024-08-07 DIAGNOSIS — R239 Unspecified skin changes: Secondary | ICD-10-CM | POA: Diagnosis not present

## 2024-08-07 DIAGNOSIS — H1045 Other chronic allergic conjunctivitis: Secondary | ICD-10-CM

## 2024-08-07 DIAGNOSIS — E663 Overweight: Secondary | ICD-10-CM

## 2024-08-07 DIAGNOSIS — N951 Menopausal and female climacteric states: Secondary | ICD-10-CM

## 2024-08-07 DIAGNOSIS — E785 Hyperlipidemia, unspecified: Secondary | ICD-10-CM

## 2024-08-07 DIAGNOSIS — I10 Essential (primary) hypertension: Secondary | ICD-10-CM | POA: Diagnosis not present

## 2024-08-07 LAB — LIPID PANEL
Cholesterol: 201 mg/dL — ABNORMAL HIGH (ref 0–200)
HDL: 57.5 mg/dL (ref >39.00)
LDL Cholesterol: 126 mg/dL — ABNORMAL HIGH (ref 0–99)
NonHDL: 143.02
Total CHOL/HDL Ratio: 3
Triglycerides: 84 mg/dL (ref 0.0–149.0)
VLDL: 16.8 mg/dL (ref 0.0–40.0)

## 2024-08-07 LAB — COMPREHENSIVE METABOLIC PANEL WITH GFR
ALT: 27 U/L (ref 0–35)
AST: 22 U/L (ref 0–37)
Albumin: 4.4 g/dL (ref 3.5–5.2)
Alkaline Phosphatase: 79 U/L (ref 39–117)
BUN: 20 mg/dL (ref 6–23)
CO2: 25 meq/L (ref 19–32)
Calcium: 9.7 mg/dL (ref 8.4–10.5)
Chloride: 105 meq/L (ref 96–112)
Creatinine, Ser: 0.73 mg/dL (ref 0.40–1.20)
GFR: 94.05 mL/min (ref >60.00)
Glucose, Bld: 97 mg/dL (ref 70–99)
Potassium: 4.2 meq/L (ref 3.5–5.1)
Sodium: 138 meq/L (ref 135–145)
Total Bilirubin: 0.4 mg/dL (ref 0.2–1.2)
Total Protein: 8 g/dL (ref 6.0–8.3)

## 2024-08-07 LAB — CBC WITH DIFFERENTIAL/PLATELET
Basophils Absolute: 0.1 K/uL (ref 0.0–0.1)
Basophils Relative: 1 % (ref 0.0–3.0)
Eosinophils Absolute: 0.9 K/uL — ABNORMAL HIGH (ref 0.0–0.7)
Eosinophils Relative: 9.8 % — ABNORMAL HIGH (ref 0.0–5.0)
HCT: 41.9 % (ref 36.0–46.0)
Hemoglobin: 14 g/dL (ref 12.0–15.0)
Lymphocytes Relative: 31.6 % (ref 12.0–46.0)
Lymphs Abs: 3 K/uL (ref 0.7–4.0)
MCHC: 33.3 g/dL (ref 30.0–36.0)
MCV: 86.5 fl (ref 78.0–100.0)
Monocytes Absolute: 0.5 K/uL (ref 0.1–1.0)
Monocytes Relative: 5.5 % (ref 3.0–12.0)
Neutro Abs: 4.9 K/uL (ref 1.4–7.7)
Neutrophils Relative %: 52.1 % (ref 43.0–77.0)
Platelets: 325 K/uL (ref 150.0–400.0)
RBC: 4.85 Mil/uL (ref 3.87–5.11)
RDW: 13.1 % (ref 11.5–15.5)
WBC: 9.4 K/uL (ref 4.0–10.5)

## 2024-08-07 LAB — TSH: TSH: 1.39 u[IU]/mL (ref 0.35–5.50)

## 2024-08-07 MED ORDER — ZEPBOUND 2.5 MG/0.5ML ~~LOC~~ SOAJ
2.5000 mg | SUBCUTANEOUS | 0 refills | Status: DC
Start: 2024-08-07 — End: 2024-08-11

## 2024-08-07 MED ORDER — ERYTHROMYCIN 5 MG/GM OP OINT: 1.0000 | TOPICAL_OINTMENT | Freq: Every day | OPHTHALMIC | 0 refills | Status: DC

## 2024-08-07 MED ORDER — VALSARTAN 40 MG PO TABS: 40.0000 mg | ORAL_TABLET | Freq: Every day | ORAL | 1 refills | Status: DC

## 2024-08-07 NOTE — Telephone Encounter (Signed)
Please do PA for Zepbound 2.5 mg.

## 2024-08-07 NOTE — Patient Instructions (Signed)
 It was great to see you!   VISIT SUMMARY: During your visit, we addressed your itchy red eyes, high blood pressure, menopausal symptoms, dry skin, and weight management. We discussed treatment options and provided prescriptions and recommendations to help manage these issues.  YOUR PLAN: ALLERGIC CONJUNCTIVITIS AND ALLERGIC RHINITIS: You have chronic allergic conjunctivitis and rhinitis, likely worsened by exposure to your cats. -Start using Pataday eye drops as prescribed. -Take an oral antihistamine like Zyrtec to help with your symptoms.  HYPERTENSION: Your blood pressure readings are consistently high, and it is important to manage this to reduce cardiac risks, especially if considering hormone therapy for menopause. -Start taking valsartan  at the lowest dose as prescribed. -We will do some blood work to monitor your condition.  MENOPAUSAL SYMPTOMS: You are experiencing hot flashes, fatigue, and mood changes related to menopause. Hormone therapy is an option but requires controlled blood pressure. -Focus on controlling your blood pressure before starting hormone therapy. -Read 'Menopause Manifesto' for more information. -Continue with your upcoming OB GYN consultation.  DRY SKIN: You have persistent dry skin with a rough texture, possibly due to menopause or allergies. -Follow the dry skin care routine handout provided. -Use a thicker moisturizer, preferably one that comes in a tub.  OVERWEIGHT: You are interested in weight loss and meet the criteria for weight loss medication. -We will send a prescription for a weight loss injection to check your insurance coverage. -Remember that lifestyle changes are important for sustainable weight loss.         Take care,  Beth Buttner PA-C

## 2024-08-07 NOTE — Progress Notes (Signed)
 [name] is a 53 y.o. female here for a new problem.  History of Present Illness:   Chief Complaint  Patient presents with   Eye Problem    Pt c/o watery eyes and itching x 1 month.   Rash    Pt c/o very rough skin all over her body x 3 months, no itching.    Discussed the use of AI scribe software for clinical note transcription with the patient, who gave verbal consent to proceed.  History of Present Illness [name] is a 53 year old female with hypertension who presents with itchy red eyes and high blood pressure.  She experiences itchy, red, and watery eyes, which she attributes to allergies, particularly due to her two cats. She has used antibiotic and allergy eye drops with limited relief. Her symptoms also include a runny nose and breathing difficulties.  She has a history of hypertension, previously managed with amlodipine , which she stopped due to severe heart palpitations. She monitors her blood pressure at home, noting consistently high readings, with diastolic often around 98-100 mmHg. She is concerned about her blood pressure management.  She notes a change in skin texture, feeling rough without itchiness, and experiences hot flashes and fatigue, which she associates with menopause. She is exploring options for managing menopause symptoms, including potential hormone therapy, and has an upcoming appointment with an OB GYN specializing in menopause.  She is exercising regularly and eating well. She has been able to lose weight in the past and is now having difficulty. She exercises at least 4 days a week and has reduced processed foods. She limits calories. She is interested in weight loss medication.    Past Medical History:  Diagnosis Date   Anemia    Asthma    Cesarean delivery delivered    DIVERTICULITIS, HX OF 12/24/2007   DIVERTICULOSIS-COLON 07/21/2009   Hematuria, microscopic 10/26/2017   Chronic, since teens; never cystoscopy.   Irritable bowel  syndrome 07/21/2009   Kidney stones    Migraine    Seasonal allergies    URINARY INCONTINENCE 06/15/2008     Social History   Tobacco Use   Smoking status: Former    Types: Cigarettes   Smokeless tobacco: Never   Tobacco comments:    smoked socially in college  Vaping Use   Vaping status: Never Used  Substance Use Topics   Alcohol use: Yes    Comment: glass of wine x 2 week   Drug use: No    Past Surgical History:  Procedure Laterality Date   CESAREAN SECTION     COLONOSCOPY  04/19/2009   diverticulosis    G2 P2     1 c-section, 1 NSVD   LEAP  98   rectocele/cystocele repair  06/2009   right fibula fx     age 33   TOTAL VAGINAL HYSTERECTOMY  06/2009   has her ovaries, and SSF, sling    Family History  Problem Relation Age of Onset   Hyperlipidemia Mother    Hypertension Mother    Colon polyps Mother    Peripheral Artery Disease Mother    Other Father        hx unknown   Diabetes Maternal Grandmother    Healthy Daughter    Healthy Daughter    Colon cancer Neg Hx    Asthma Neg Hx    Esophageal cancer Neg Hx    Stomach cancer Neg Hx    Pancreatic cancer Neg Hx  Liver disease Neg Hx     No Known Allergies  Current Medications:   Current Outpatient Medications:    polymixin-bacitracin (POLYSPORIN) 500-10000 UNIT/GM OINT ointment, Apply 1 Application topically 2 (two) times daily., Disp: , Rfl:  No current facility-administered medications for this visit.  Facility-Administered Medications Ordered in Other Visits:    [COMPLETED] sodium chloride  0.9 % nebulizer solution 3 mL, 3 mL, Nebulization, Once, 3 mL at 12/27/11 1503 **FOLLOWED BY** [COMPLETED] methacholine  (PROVOCHOLINE ) inhaler solution 0.125 mg, 2 mL, Inhalation, Once, 0.125 mg at 12/27/11 1513 **FOLLOWED BY** [COMPLETED] methacholine  (PROVOCHOLINE ) inhaler solution 0.5 mg, 2 mL, Inhalation, Once, 0.5 mg at 12/27/11 1512 **FOLLOWED BY** methacholine  (PROVOCHOLINE ) inhaler solution 2 mg, 2 mL,  Inhalation, Once **FOLLOWED BY** methacholine  (PROVOCHOLINE ) inhaler solution 8 mg, 2 mL, Inhalation, Once **FOLLOWED BY** methacholine  (PROVOCHOLINE ) inhaler solution 32 mg, 2 mL, Inhalation, Once **FOLLOWED BY** albuterol  (PROVENTIL ) (5 MG/ML) 0.5% nebulizer solution 2.5 mg, 2.5 mg, Nebulization, Once, Geronimo Amel, MD   Review of Systems:   Negative unless otherwise specified per HPI.  Vitals:   Vitals:   08/07/24 0853  BP: (!) 150/100  Pulse: 81  Temp: 97.7 F (36.5 C)  TempSrc: Temporal  SpO2: 98%  Weight: 141 lb (64 kg)  Height: 4' 11.5 (1.511 m)     Body mass index is 28 kg/m.  Physical Exam:   Physical Exam Vitals and nursing note reviewed.  Constitutional:      General: She is not in acute distress.    Appearance: She is well-developed. She is not ill-appearing or toxic-appearing.  HENT:     Head: Normocephalic and atraumatic.     Right Ear: Tympanic membrane, ear canal and external ear normal. Tympanic membrane is not erythematous, retracted or bulging.     Left Ear: Tympanic membrane, ear canal and external ear normal. Tympanic membrane is not erythematous, retracted or bulging.     Nose: Nose normal.     Right Sinus: No maxillary sinus tenderness or frontal sinus tenderness.     Left Sinus: No maxillary sinus tenderness or frontal sinus tenderness.     Mouth/Throat:     Pharynx: Uvula midline. No posterior oropharyngeal erythema.  Eyes:     General: Lids are normal.     Conjunctiva/sclera:     Right eye: Right conjunctiva is injected.  Neck:     Trachea: Trachea normal.  Cardiovascular:     Rate and Rhythm: Normal rate and regular rhythm.     Pulses: Normal pulses.     Heart sounds: Normal heart sounds, S1 normal and S2 normal.  Pulmonary:     Effort: Pulmonary effort is normal.     Breath sounds: Normal breath sounds. No decreased breath sounds, wheezing, rhonchi or rales.  Lymphadenopathy:     Cervical: No cervical adenopathy.  Skin:     General: Skin is warm and dry.     Comments: Extremities with sandpaper like raised bumps  Neurological:     Mental Status: She is alert.     GCS: GCS eye subscore is 4. GCS verbal subscore is 5. GCS motor subscore is 6.  Psychiatric:        Speech: Speech normal.        Behavior: Behavior normal. Behavior is cooperative.     Assessment and Plan:   Assessment and Plan Assessment & Plan Allergy, initial exposure Chronic allergic conjunctivitis and rhinitis likely exacerbated by cat exposure. Possible allergic component affecting skin and breathing. - recommend Pataday eye drops. -  Recommend oral antihistamine such as Zyrtec.  Hypertension Chronic hypertension with readings around 150/100 mmHg. Previous amlodipine  caused palpitations. Blood pressure control necessary for hormone therapy for menopausal symptoms. Discussed importance of controlling blood pressure to reduce cardiac risk with hormone therapy. - Prescribe valsartan  40 mg daily  - Order blood work.  Menopausal symptoms Menopausal symptoms include hot flashes, fatigue, and mood changes. Hormone therapy considered but requires blood pressure control. Discussed unreliability of saliva hormone testing and risks of compounded medications. - Discuss importance of blood pressure control before hormone therapy. - Encourage OB GYN consultation.  Skin change Persistent dry skin with rough texture, possibly related to menopause or allergies. Current moisturizer may not be effective. - Provide dry skin care routine handout. - Recommend thicker moisturizer, preferably in a tub.  Overweight Overweight with BMI barely meeting criteria for weight loss medication. Interest in weight loss injections and lifestyle changes. Discussed benefits of weight loss injections and importance of lifestyle changes for sustainable weight loss. - Send prescription for weight loss injection to check insurance coverage. - Discuss importance of lifestyle  changes alongside medication.     Lucie Buttner, PA-C

## 2024-08-08 ENCOUNTER — Telehealth: Payer: Self-pay

## 2024-08-08 ENCOUNTER — Other Ambulatory Visit (HOSPITAL_COMMUNITY): Payer: Self-pay

## 2024-08-08 ENCOUNTER — Ambulatory Visit: Payer: Self-pay | Admitting: Physician Assistant

## 2024-08-08 NOTE — Telephone Encounter (Signed)
 Pharmacy Patient Advocate Encounter   Received notification from Pt Calls Messages that prior authorization for Zepbound  2.5MG /0.5ML auto-injectors is required/requested.   Insurance verification completed.   The patient is insured through [employer] .   Per test claim:

## 2024-08-11 NOTE — Telephone Encounter (Signed)
 Pt aware.

## 2024-08-11 NOTE — Addendum Note (Signed)
 Addended by: THURMON ARLAND PARAS on: 08/11/2024 08:27 AM   Modules accepted: Orders

## 2024-08-12 ENCOUNTER — Other Ambulatory Visit (HOSPITAL_COMMUNITY): Payer: Self-pay

## 2024-10-07 ENCOUNTER — Encounter: Payer: Self-pay | Admitting: Physician Assistant

## 2024-10-07 ENCOUNTER — Ambulatory Visit (INDEPENDENT_AMBULATORY_CARE_PROVIDER_SITE_OTHER): Admitting: Physician Assistant

## 2024-10-07 VITALS — BP 130/80 | HR 65 | Temp 98.6°F | Ht 59.5 in | Wt 137.4 lb

## 2024-10-07 DIAGNOSIS — J4520 Mild intermittent asthma, uncomplicated: Secondary | ICD-10-CM

## 2024-10-07 DIAGNOSIS — N951 Menopausal and female climacteric states: Secondary | ICD-10-CM | POA: Diagnosis not present

## 2024-10-07 DIAGNOSIS — I10 Essential (primary) hypertension: Secondary | ICD-10-CM

## 2024-10-07 MED ORDER — ESTRADIOL 0.01 % VA CREA
TOPICAL_CREAM | VAGINAL | 2 refills | Status: AC
Start: 2024-10-07 — End: ?

## 2024-10-07 MED ORDER — ALBUTEROL SULFATE HFA 108 (90 BASE) MCG/ACT IN AERS: 2.0000 | INHALATION_SPRAY | Freq: Four times a day (QID) | RESPIRATORY_TRACT | 0 refills | Status: DC | PRN

## 2024-10-07 NOTE — Patient Instructions (Signed)
 Wt Readings from Last 3 Encounters:  10/07/24 137 lb 6.1 oz (62.3 kg)  08/07/24 141 lb (64 kg)  12/19/23 136 lb (61.7 kg)

## 2024-10-07 NOTE — Progress Notes (Signed)
 [name] is a 53 y.o. female here for a follow up of a pre-existing problem.  History of Present Illness:   Chief Complaint  Patient presents with   Medical Management of Chronic Issues    Pt here for f/u on blood pressure.   Discussed the use of AI scribe software for clinical note transcription with the patient, who gave verbal consent to proceed.  History of Present Illness   [name] is a 53 year old female who presents with improved menopausal symptoms and joint pain relief after starting GLP-1 and NAD peptide therapy.  [name] has experienced significant improvement in menopausal symptoms and joint pain since starting GLP-1 and NAD peptide therapy six weeks ago. She describes the treatment as 'life changing' and is now able to sleep through the night for the first time in thirty years.  She reports relief from chronic inflammation and joint pain, which previously caused significant discomfort and impaired mobility. Her regimen includes a weekly GLP-1 injection and NAD peptides every two to three days, adjusted based on her energy levels. She has increased energy, improved focus, and cessation of hot flashes since starting the therapy.  She has not started hormone replacement therapy, which was prescribed for bone density and vaginal dryness. She has not experienced vaginal dryness or painful intercourse, though early signs of dryness were noted. She continues to take her blood pressure medication, valsartan  40 mg daily.       Past Medical History:  Diagnosis Date   Anemia    Asthma    Cesarean delivery delivered    DIVERTICULITIS, HX OF 12/24/2007   DIVERTICULOSIS-COLON 07/21/2009   Hematuria, microscopic 10/26/2017   Chronic, since teens; never cystoscopy.   Irritable bowel syndrome 07/21/2009   Kidney stones    Migraine    Seasonal allergies    URINARY INCONTINENCE 06/15/2008     Social History   Tobacco Use   Smoking status: Former    Types:  Cigarettes   Smokeless tobacco: Never   Tobacco comments:    smoked socially in college  Vaping Use   Vaping status: Never Used  Substance Use Topics   Alcohol use: Yes    Comment: glass of wine x 2 week   Drug use: No    Past Surgical History:  Procedure Laterality Date   CESAREAN SECTION     COLONOSCOPY  04/19/2009   diverticulosis    G2 P2     1 c-section, 1 NSVD   LEAP  98   rectocele/cystocele repair  06/2009   right fibula fx     age 35   TOTAL VAGINAL HYSTERECTOMY  06/2009   has her ovaries, and SSF, sling    Family History  Problem Relation Age of Onset   Hyperlipidemia Mother    Hypertension Mother    Colon polyps Mother    Peripheral Artery Disease Mother    Other Father        hx unknown   Diabetes Maternal Grandmother    Healthy Daughter    Healthy Daughter    Colon cancer Neg Hx    Asthma Neg Hx    Esophageal cancer Neg Hx    Stomach cancer Neg Hx    Pancreatic cancer Neg Hx    Liver disease Neg Hx     No Known Allergies  Current Medications:   Current Outpatient Medications:    albuterol  (VENTOLIN  HFA) 108 (90 Base) MCG/ACT inhaler, Inhale 2 puffs into the lungs  every 6 (six) hours as needed for wheezing or shortness of breath., Disp: 8 g, Rfl: 0   estradiol (ESTRACE) 0.01 % CREA vaginal cream, Place 5 g intravaginally once daily for 2 weeks; then place 5 g intravaginally one to three times per week. If any vaginal bleeding, stop and notify provider., Disp: 42.5 g, Rfl: 2   PRESCRIPTION MEDICATION, Inject 0.75 mLs into the skin. NAD MDV, every 2-3 days, Disp: , Rfl:    SEMAGLUTIDE [place], Inject 0.2 mLs into the skin once a week., Disp: , Rfl:    valsartan  (DIOVAN ) 40 MG tablet, Take 1 tablet (40 mg total) by mouth daily., Disp: 30 tablet, Rfl: 1   LYLLANA 0.05 MG/24HR patch, 1 patch 2 (two) times a week. (Patient not taking: Reported on 10/07/2024), Disp: , Rfl:  No current facility-administered medications for this visit.  Facility-Administered  Medications Ordered in Other Visits:    [COMPLETED] sodium chloride  0.9 % nebulizer solution 3 mL, 3 mL, Nebulization, Once, 3 mL at 12/27/11 1503 **FOLLOWED BY** [COMPLETED] methacholine  (PROVOCHOLINE ) inhaler solution 0.125 mg, 2 mL, Inhalation, Once, 0.125 mg at 12/27/11 1513 **FOLLOWED BY** [COMPLETED] methacholine  (PROVOCHOLINE ) inhaler solution 0.5 mg, 2 mL, Inhalation, Once, 0.5 mg at 12/27/11 1512 **FOLLOWED BY** methacholine  (PROVOCHOLINE ) inhaler solution 2 mg, 2 mL, Inhalation, Once **FOLLOWED BY** methacholine  (PROVOCHOLINE ) inhaler solution 8 mg, 2 mL, Inhalation, Once **FOLLOWED BY** methacholine  (PROVOCHOLINE ) inhaler solution 32 mg, 2 mL, Inhalation, Once **FOLLOWED BY** albuterol  (PROVENTIL ) (5 MG/ML) 0.5% nebulizer solution 2.5 mg, 2.5 mg, Nebulization, Once, Geronimo Amel, MD   Review of Systems:   Negative unless otherwise specified per HPI.  Vitals:   Vitals:   10/07/24 1008  BP: 130/80  Pulse: 65  Temp: 98.6 F (37 C)  TempSrc: Temporal  SpO2: 98%  Weight: 137 lb 6.1 oz (62.3 kg)  Height: 4' 11.5 (1.511 m)     Body mass index is 27.28 kg/m.  Physical Exam:   Physical Exam Vitals and nursing note reviewed.  Constitutional:      General: She is not in acute distress.    Appearance: She is well-developed. She is not ill-appearing or toxic-appearing.  Cardiovascular:     Rate and Rhythm: Normal rate and regular rhythm.     Pulses: Normal pulses.     Heart sounds: Normal heart sounds, S1 normal and S2 normal.  Pulmonary:     Effort: Pulmonary effort is normal.     Breath sounds: Normal breath sounds.  Skin:    General: Skin is warm and dry.  Neurological:     Mental Status: She is alert.     GCS: GCS eye subscore is 4. GCS verbal subscore is 5. GCS motor subscore is 6.  Psychiatric:        Speech: Speech normal.        Behavior: Behavior normal. Behavior is cooperative.     Assessment and Plan:   Assessment and Plan    Menopausal  symptoms Symptoms improved with GLP-1 and NAD peptides. Considering HRT for bone density and vaginal health but cautious due to symptom improvement and potential blood pressure effects. Discussed vaginal estrogen cream as safe for those with breast cancer history. - Prescribed vaginal estrogen cream nightly for two weeks, then twice weekly. - Defer to gynecology for further management   Essential hypertension Blood pressure controlled at 130/80 mmHg. Discussed potential impact of estrogen on blood pressure if HRT is initiated. - Continue current antihypertensive medication, valsartan  40 mg daily - Monitor blood pressure  if starting HRT patch. Follow up in 6 month(s), sooner if concerns  Mild intermittent asthma without complication  Requires albuterol  for symptom control. - Prescribed albuterol  inhaler.       Lucie Buttner, PA-C

## 2024-10-18 ENCOUNTER — Other Ambulatory Visit: Payer: Self-pay | Admitting: Physician Assistant

## 2024-10-31 ENCOUNTER — Other Ambulatory Visit: Payer: Self-pay | Admitting: Physician Assistant

## 2024-11-29 ENCOUNTER — Other Ambulatory Visit: Payer: Self-pay | Admitting: Physician Assistant
